# Patient Record
Sex: Female | Born: 1990 | Race: White | Hispanic: No | Marital: Single | Smoking: Former smoker
Health system: Southern US, Community
[De-identification: ages and names within clinical notes are randomized; demographics above are authoritative.]

## PROBLEM LIST (undated history)

## (undated) DIAGNOSIS — F419 Anxiety disorder, unspecified: Secondary | ICD-10-CM

## (undated) DIAGNOSIS — F32A Depression, unspecified: Secondary | ICD-10-CM

## (undated) DIAGNOSIS — B009 Herpesviral infection, unspecified: Secondary | ICD-10-CM

## (undated) DIAGNOSIS — F329 Major depressive disorder, single episode, unspecified: Secondary | ICD-10-CM

## (undated) HISTORY — DX: Anxiety disorder, unspecified: F41.9

## (undated) HISTORY — DX: Herpesviral infection, unspecified: B00.9

## (undated) HISTORY — PX: WISDOM TOOTH EXTRACTION: SHX21

## (undated) HISTORY — DX: Major depressive disorder, single episode, unspecified: F32.9

## (undated) HISTORY — DX: Depression, unspecified: F32.A

---

## 2012-11-22 LAB — OB RESULTS CONSOLE RPR: RPR: NONREACTIVE

## 2012-11-22 LAB — OB RESULTS CONSOLE HGB/HCT, BLOOD
HCT: 40 %
Hemoglobin: 13.8 g/dL

## 2012-11-22 LAB — OB RESULTS CONSOLE ABO/RH: RH Type: POSITIVE

## 2012-11-22 LAB — OB RESULTS CONSOLE HIV ANTIBODY (ROUTINE TESTING): HIV: NONREACTIVE

## 2012-11-22 LAB — OB RESULTS CONSOLE ANTIBODY SCREEN: Antibody Screen: NEGATIVE

## 2012-11-22 LAB — OB RESULTS CONSOLE VARICELLA ZOSTER ANTIBODY, IGG: Varicella: IMMUNE

## 2013-02-15 ENCOUNTER — Other Ambulatory Visit (HOSPITAL_COMMUNITY)
Admission: RE | Admit: 2013-02-15 | Discharge: 2013-02-15 | Disposition: A | Discharge status: Home / Self Care | Payer: Medicaid Other | Source: Ambulatory Visit | Attending: Obstetrics & Gynecology | Admitting: Obstetrics & Gynecology

## 2013-02-15 ENCOUNTER — Ambulatory Visit (INDEPENDENT_AMBULATORY_CARE_PROVIDER_SITE_OTHER): Payer: Medicaid Other | Admitting: Obstetrics & Gynecology

## 2013-02-15 ENCOUNTER — Encounter: Payer: Self-pay | Admitting: Obstetrics & Gynecology

## 2013-02-15 VITALS — BP 107/55 | Ht 62.0 in | Wt 123.0 lb

## 2013-02-15 DIAGNOSIS — N760 Acute vaginitis: Secondary | ICD-10-CM | POA: Insufficient documentation

## 2013-02-15 DIAGNOSIS — Z3480 Encounter for supervision of other normal pregnancy, unspecified trimester: Secondary | ICD-10-CM

## 2013-02-15 DIAGNOSIS — Z3492 Encounter for supervision of normal pregnancy, unspecified, second trimester: Secondary | ICD-10-CM

## 2013-02-15 NOTE — Patient Instructions (Signed)
Pregnancy - Second Trimester The second trimester of pregnancy (3 to 6 months) is a period of rapid growth for you and your baby. At the end of the sixth month, your baby is about 9 inches long and weighs 1 1/2 pounds. You will begin to feel the baby move between 18 and 20 weeks of the pregnancy. This is called quickening. Weight gain is faster. A clear fluid (colostrum) may leak out of your breasts. You may feel small contractions of the womb (uterus). This is known as false labor or Braxton-Hicks contractions. This is like a practice for labor when the baby is ready to be born. Usually, the problems with morning sickness have usually passed by the end of your first trimester. Some women develop small dark blotches (called cholasma, mask of pregnancy) on their face that usually goes away after the baby is born. Exposure to the sun makes the blotches worse. Acne may also develop in some pregnant women and pregnant women who have acne, may find that it goes away. PRENATAL EXAMS  Blood work may continue to be done during prenatal exams. These tests are done to check on your health and the probable health of your baby. Blood work is used to follow your blood levels (hemoglobin). Anemia (low hemoglobin) is common during pregnancy. Iron and vitamins are given to help prevent this. You will also be checked for diabetes between 24 and 28 weeks of the pregnancy. Some of the previous blood tests may be repeated.  The size of the uterus is measured during each visit. This is to make sure that the baby is continuing to grow properly according to the dates of the pregnancy.  Your blood pressure is checked every prenatal visit. This is to make sure you are not getting toxemia.  Your urine is checked to make sure you do not have an infection, diabetes or protein in the urine.  Your weight is checked often to make sure gains are happening at the suggested rate. This is to ensure that both you and your baby are  growing normally.  Sometimes, an ultrasound is performed to confirm the proper growth and development of the baby. This is a test which bounces harmless sound waves off the baby so your caregiver can more accurately determine due dates. Sometimes, a test is done on the amniotic fluid surrounding the baby. This test is called an amniocentesis. The amniotic fluid is obtained by sticking a needle into the belly (abdomen). This is done to check the chromosomes in instances where there is a concern about possible genetic problems with the baby. It is also sometimes done near the end of pregnancy if an early delivery is required. In this case, it is done to help make sure the baby's lungs are mature enough for the baby to live outside of the womb. CHANGES OCCURING IN THE SECOND TRIMESTER OF PREGNANCY Your body goes through many changes during pregnancy. They vary from person to person. Talk to your caregiver about changes you notice that you are concerned about.  During the second trimester, you will likely have an increase in your appetite. It is normal to have cravings for certain foods. This varies from person to person and pregnancy to pregnancy.  Your lower abdomen will begin to bulge.  You may have to urinate more often because the uterus and baby are pressing on your bladder. It is also common to get more bladder infections during pregnancy. You can help this by drinking lots of fluids   and emptying your bladder before and after intercourse.  You may begin to get stretch marks on your hips, abdomen, and breasts. These are normal changes in the body during pregnancy. There are no exercises or medicines to take that prevent this change.  You may begin to develop swollen and bulging veins (varicose veins) in your legs. Wearing support hose, elevating your feet for 15 minutes, 3 to 4 times a day and limiting salt in your diet helps lessen the problem.  Heartburn may develop as the uterus grows and  pushes up against the stomach. Antacids recommended by your caregiver helps with this problem. Also, eating smaller meals 4 to 5 times a day helps.  Constipation can be treated with a stool softener or adding bulk to your diet. Drinking lots of fluids, and eating vegetables, fruits, and whole grains are helpful.  Exercising is also helpful. If you have been very active up until your pregnancy, most of these activities can be continued during your pregnancy. If you have been less active, it is helpful to start an exercise program such as walking.  Hemorrhoids may develop at the end of the second trimester. Warm sitz baths and hemorrhoid cream recommended by your caregiver helps hemorrhoid problems.  Backaches may develop during this time of your pregnancy. Avoid heavy lifting, wear low heal shoes, and practice good posture to help with backache problems.  Some pregnant women develop tingling and numbness of their hand and fingers because of swelling and tightening of ligaments in the wrist (carpel tunnel syndrome). This goes away after the baby is born.  As your breasts enlarge, you may have to get a bigger bra. Get a comfortable, cotton, support bra. Do not get a nursing bra until the last month of the pregnancy if you will be nursing the baby.  You may get a dark line from your belly button to the pubic area called the linea nigra.  You may develop rosy cheeks because of increase blood flow to the face.  You may develop spider looking lines of the face, neck, arms, and chest. These go away after the baby is born. HOME CARE INSTRUCTIONS   It is extremely important to avoid all smoking, herbs, alcohol, and unprescribed drugs during your pregnancy. These chemicals affect the formation and growth of the baby. Avoid these chemicals throughout the pregnancy to ensure the delivery of a healthy infant.  Most of your home care instructions are the same as suggested for the first trimester of your  pregnancy. Keep your caregiver's appointments. Follow your caregiver's instructions regarding medicine use, exercise, and diet.  During pregnancy, you are providing food for you and your baby. Continue to eat regular, well-balanced meals. Choose foods such as meat, fish, milk and other low fat dairy products, vegetables, fruits, and whole-grain breads and cereals. Your caregiver will tell you of the ideal weight gain.  A physical sexual relationship may be continued up until near the end of pregnancy if there are no other problems. Problems could include early (premature) leaking of amniotic fluid from the membranes, vaginal bleeding, abdominal pain, or other medical or pregnancy problems.  Exercise regularly if there are no restrictions. Check with your caregiver if you are unsure of the safety of some of your exercises. The greatest weight gain will occur in the last 2 trimesters of pregnancy. Exercise will help you:  Control your weight.  Get you in shape for labor and delivery.  Lose weight after you have the baby.  Wear   a good support or jogging bra for breast tenderness during pregnancy. This may help if worn during sleep. Pads or tissues may be used in the bra if you are leaking colostrum.  Do not use hot tubs, steam rooms or saunas throughout the pregnancy.  Wear your seat belt at all times when driving. This protects you and your baby if you are in an accident.  Avoid raw meat, uncooked cheese, cat litter boxes, and soil used by cats. These carry germs that can cause birth defects in the baby.  The second trimester is also a good time to visit your dentist for your dental health if this has not been done yet. Getting your teeth cleaned is okay. Use a soft toothbrush. Brush gently during pregnancy.  It is easier to leak urine during pregnancy. Tightening up and strengthening the pelvic muscles will help with this problem. Practice stopping your urination while you are going to the  bathroom. These are the same muscles you need to strengthen. It is also the muscles you would use as if you were trying to stop from passing gas. You can practice tightening these muscles up 10 times a set and repeating this about 3 times per day. Once you know what muscles to tighten up, do not perform these exercises during urination. It is more likely to contribute to an infection by backing up the urine.  Ask for help if you have financial, counseling, or nutritional needs during pregnancy. Your caregiver will be able to offer counseling for these needs as well as refer you for other special needs.  Your skin may become oily. If so, wash your face with mild soap, use non-greasy moisturizer and oil or cream based makeup. MEDICINES AND DRUG USE IN PREGNANCY  Take prenatal vitamins as directed. The vitamin should contain 1 milligram of folic acid. Keep all vitamins out of reach of children. Only a couple vitamins or tablets containing iron may be fatal to a baby or young child when ingested.  Avoid use of all medicines, including herbs, over-the-counter medicines, not prescribed or suggested by your caregiver. Only take over-the-counter or prescription medicines for pain, discomfort, or fever as directed by your caregiver. Do not use aspirin.  Let your caregiver also know about herbs you may be using.  Alcohol is related to a number of birth defects. This includes fetal alcohol syndrome. All alcohol, in any form, should be avoided completely. Smoking will cause low birth rate and premature babies.  Street or illegal drugs are very harmful to the baby. They are absolutely forbidden. A baby born to an addicted mother will be addicted at birth. The baby will go through the same withdrawal an adult does. SEEK MEDICAL CARE IF:  You have any concerns or worries during your pregnancy. It is better to call with your questions if you feel they cannot wait, rather than worry about them. SEEK IMMEDIATE  MEDICAL CARE IF:   An unexplained oral temperature above 102 F (38.9 C) develops, or as your caregiver suggests.  You have leaking of fluid from the vagina (birth canal). If leaking membranes are suspected, take your temperature and tell your caregiver of this when you call.  There is vaginal spotting, bleeding, or passing clots. Tell your caregiver of the amount and how many pads are used. Light spotting in pregnancy is common, especially following intercourse.  You develop a bad smelling vaginal discharge with a change in the color from clear to white.  You continue to feel   sick to your stomach (nauseated) and have no relief from remedies suggested. You vomit blood or coffee ground-like materials.  You lose more than 2 pounds of weight or gain more than 2 pounds of weight over 1 week, or as suggested by your caregiver.  You notice swelling of your face, hands, feet, or legs.  You get exposed to German measles and have never had them.  You are exposed to fifth disease or chickenpox.  You develop belly (abdominal) pain. Round ligament discomfort is a common non-cancerous (benign) cause of abdominal pain in pregnancy. Your caregiver still must evaluate you.  You develop a bad headache that does not go away.  You develop fever, diarrhea, pain with urination, or shortness of breath.  You develop visual problems, blurry, or double vision.  You fall or are in a car accident or any kind of trauma.  There is mental or physical violence at home. Document Released: 04/29/2001 Document Revised: 01/28/2012 Document Reviewed: 11/01/2008 [employer] Patient Information 2014 [employer].  

## 2013-02-15 NOTE — Progress Notes (Signed)
P - 79 - pt is a transfer from [facility] in [place], [place]

## 2013-02-15 NOTE — Progress Notes (Signed)
   Subjective: transfer from [facility] is a G1P0 [redacted]w[redacted]d being seen today for her first obstetrical visit.  Her obstetrical history is significant for former smoker. Patient does intend to breast feed. Pregnancy history fully reviewed.  Patient reports no complaints.  Filed Vitals:   02/15/13 0946 02/15/13 0947  BP: 107/55   Height:  5\' 2"  (1.575 m)  Weight: 123 lb (55.792 kg)     HISTORY: OB History  Gravida Para Term Preterm AB SAB TAB Ectopic Multiple Living  1             # Outcome Date GA Lbr Len/2nd Weight Sex Delivery Anes PTL Lv  1 CUR              Past Medical History  Diagnosis Date  . Depression   . Anxiety    Past Surgical History  Procedure Laterality Date  . Wisdom tooth extraction     Family History  Problem Relation Age of Onset  . Hypertension Mother   . Ovarian cancer Paternal Grandmother   . Ovarian cancer Maternal Grandmother   . Breast cancer Paternal Grandmother   . Breast cancer Maternal Grandmother      Exam    Uterus:     Pelvic Exam:    Perineum: No Hemorrhoids   Vulva: normal   Vagina:  normal mucosa   pH:     Cervix: no lesions   Adnexa: normal adnexa   Bony Pelvis: average  System: Breast:  normal appearance, no masses or tenderness   Skin: normal coloration and turgor, small area rash over sternum   Neurologic: oriented, normal mood   Extremities: normal strength, tone, and muscle mass   HEENT sclera clear, anicteric, oropharynx clear, no lesions, thyroid without masses and trachea midline   Mouth/Teeth mucous membranes moist, pharynx normal without lesions, dental hygiene good and dental carie #15   Neck supple   Cardiovascular: regular rate and rhythm, no murmurs or gallops   Respiratory:  appears well, vitals normal, no respiratory distress, acyanotic, normal RR, neck free of mass or lymphadenopathy, chest clear, no wheezing, crepitations, rhonchi, normal symmetric air entry   Abdomen: soft, non-tender; bowel  sounds normal; no masses,  no organomegaly   Urinary: urethral meatus normal      Assessment:    Pregnancy: G1P0 Patient Active Problem List   Diagnosis Date Noted  . Supervision of normal pregnancy in second trimester 02/15/2013        Plan:     Initial labs drawn. Prenatal vitamins. Problem list reviewed and updated. Genetic Screening discussed Quad Screen: ordered.  Ultrasound discussed; fetal survey: ordered.  Follow up in 4 weeks. 50% of 30 min visit spent on counseling and coordination of care.  Wet prep   ARNOLD,JAMES 02/15/2013

## 2013-02-17 LAB — ALPHA FETOPROTEIN, MATERNAL
Curr Gest Age: 18 wks.days
MoM for AFP: 1.47
Open Spina bifida: NEGATIVE

## 2013-02-17 LAB — GC/CHLAMYDIA PROBE AMP
CT Probe RNA: NEGATIVE
GC Probe RNA: NEGATIVE

## 2013-02-21 ENCOUNTER — Telehealth: Payer: Self-pay | Admitting: *Deleted

## 2013-02-21 DIAGNOSIS — B9689 Other specified bacterial agents as the cause of diseases classified elsewhere: Secondary | ICD-10-CM

## 2013-02-21 MED ORDER — METRONIDAZOLE 500 MG PO TABS: 500.0000 mg | ORAL_TABLET | Freq: Two times a day (BID) | ORAL | Status: DC

## 2013-02-21 NOTE — Telephone Encounter (Signed)
Called pt to adv meds have been sent to pharm - LM with Female who answered phone - he adv will have pt call me back.

## 2013-02-22 ENCOUNTER — Encounter: Payer: Self-pay | Admitting: *Deleted

## 2013-02-24 ENCOUNTER — Other Ambulatory Visit: Payer: Self-pay | Admitting: Obstetrics & Gynecology

## 2013-02-24 ENCOUNTER — Ambulatory Visit (HOSPITAL_COMMUNITY)
Admission: RE | Admit: 2013-02-24 | Discharge: 2013-02-24 | Disposition: A | Discharge status: Home / Self Care | Payer: Medicaid Other | Source: Ambulatory Visit | Attending: Obstetrics & Gynecology | Admitting: Obstetrics & Gynecology

## 2013-02-24 DIAGNOSIS — Z3492 Encounter for supervision of normal pregnancy, unspecified, second trimester: Secondary | ICD-10-CM

## 2013-02-24 DIAGNOSIS — O358XX0 Maternal care for other (suspected) fetal abnormality and damage, not applicable or unspecified: Secondary | ICD-10-CM | POA: Insufficient documentation

## 2013-02-24 DIAGNOSIS — Z1389 Encounter for screening for other disorder: Secondary | ICD-10-CM | POA: Insufficient documentation

## 2013-02-24 DIAGNOSIS — O99330 Smoking (tobacco) complicating pregnancy, unspecified trimester: Secondary | ICD-10-CM | POA: Insufficient documentation

## 2013-02-24 DIAGNOSIS — Z363 Encounter for antenatal screening for malformations: Secondary | ICD-10-CM | POA: Insufficient documentation

## 2013-03-15 ENCOUNTER — Ambulatory Visit (INDEPENDENT_AMBULATORY_CARE_PROVIDER_SITE_OTHER): Payer: Medicaid Other | Admitting: Obstetrics & Gynecology

## 2013-03-15 VITALS — BP 109/60 | Wt 129.0 lb

## 2013-03-15 DIAGNOSIS — Z3480 Encounter for supervision of other normal pregnancy, unspecified trimester: Secondary | ICD-10-CM

## 2013-03-15 DIAGNOSIS — Z3492 Encounter for supervision of normal pregnancy, unspecified, second trimester: Secondary | ICD-10-CM

## 2013-03-15 NOTE — Progress Notes (Signed)
P= 80 

## 2013-03-15 NOTE — Patient Instructions (Signed)
Pregnancy - Second Trimester The second trimester is the period between 13 to 27 weeks of your pregnancy. It is important to follow your doctor's instructions. HOME CARE   Do not smoke.  Do not drink alcohol or use drugs.  Only take medicine as told by your doctor.  Take prenatal vitamins as told. The vitamin should contain 1 milligram of folic acid.  Exercise.  Eat healthy foods. Eat regular, well-balanced meals.  You can have sex (intercourse) if there are no other problems with the pregnancy.  Do not use hot tubs, steam rooms, or saunas.  Wear a seat belt while driving.  Avoid raw meat, uncooked cheese, and litter boxes and soil used by cats.  Visit your dentist. Cleanings are okay. GET HELP RIGHT AWAY IF:   You have a temperature by mouth above 102 F (38.9 C), not controlled by medicine.  Fluid is coming from your vagina.  Blood is coming from your vagina. Light spotting is common, especially after sex (intercourse).  You have a bad smelling fluid (discharge) coming from the vagina. The fluid changes from clear to white.  You still feel sick to your stomach (nauseous).  You throw up (vomit) blood.  You lose or gain more than 2 pounds (0.9 kilograms) of weight in a week, or as suggested by your doctor.  Your face, hands, feet, or legs get puffy (swell).  You get exposed to German measles and have never had them.  You get exposed to fifth disease or chickenpox.  You have belly (abdominal) pain.  You have a bad headache that will not go away.  You have watery poop (diarrhea), pain when you pee (urinate), or have shortness of breath.  You start to have problems seeing (blurry or double vision).  You fall, are in a car accident, or have any kind of trauma.  There is mental or physical violence at home.  You have any concerns or worries during your pregnancy. MAKE SURE YOU:   Understand these instructions.  Will watch your condition.  Will get help  right away if you are not doing well or get worse. Document Released: 07/30/2009 Document Revised: 07/28/2011 Document Reviewed: 07/30/2009 Exit[employer] Patient Information 2014 [employer].  

## 2013-03-15 NOTE — Progress Notes (Signed)
[place] was nl, rec f/u for cardiac OFT views

## 2013-03-18 ENCOUNTER — Ambulatory Visit (HOSPITAL_COMMUNITY)
Admission: RE | Admit: 2013-03-18 | Discharge: 2013-03-18 | Disposition: A | Discharge status: Home / Self Care | Payer: Medicaid Other | Source: Ambulatory Visit | Attending: Obstetrics & Gynecology | Admitting: Obstetrics & Gynecology

## 2013-03-18 ENCOUNTER — Ambulatory Visit (HOSPITAL_COMMUNITY): Admission: RE | Admit: 2013-03-18 | Payer: Medicaid Other | Source: Ambulatory Visit

## 2013-03-18 DIAGNOSIS — O99330 Smoking (tobacco) complicating pregnancy, unspecified trimester: Secondary | ICD-10-CM | POA: Insufficient documentation

## 2013-03-18 DIAGNOSIS — Z3689 Encounter for other specified antenatal screening: Secondary | ICD-10-CM | POA: Insufficient documentation

## 2013-03-18 DIAGNOSIS — Z3492 Encounter for supervision of normal pregnancy, unspecified, second trimester: Secondary | ICD-10-CM

## 2013-04-05 ENCOUNTER — Other Ambulatory Visit (HOSPITAL_COMMUNITY)
Admission: RE | Admit: 2013-04-05 | Discharge: 2013-04-05 | Disposition: A | Discharge status: Home / Self Care | Payer: Medicaid Other | Source: Ambulatory Visit | Attending: Obstetrics & Gynecology | Admitting: Obstetrics & Gynecology

## 2013-04-05 ENCOUNTER — Ambulatory Visit (INDEPENDENT_AMBULATORY_CARE_PROVIDER_SITE_OTHER): Payer: Medicaid Other | Admitting: Obstetrics & Gynecology

## 2013-04-05 VITALS — BP 111/74 | Wt 133.0 lb

## 2013-04-05 DIAGNOSIS — Z3480 Encounter for supervision of other normal pregnancy, unspecified trimester: Secondary | ICD-10-CM

## 2013-04-05 DIAGNOSIS — Z3492 Encounter for supervision of normal pregnancy, unspecified, second trimester: Secondary | ICD-10-CM

## 2013-04-05 DIAGNOSIS — Z3482 Encounter for supervision of other normal pregnancy, second trimester: Secondary | ICD-10-CM

## 2013-04-05 DIAGNOSIS — Z23 Encounter for immunization: Secondary | ICD-10-CM

## 2013-04-05 DIAGNOSIS — N760 Acute vaginitis: Secondary | ICD-10-CM | POA: Insufficient documentation

## 2013-04-05 MED ORDER — FLUCONAZOLE 150 MG PO TABS: 150.0000 mg | ORAL_TABLET | Freq: Once | ORAL | Status: DC

## 2013-04-05 NOTE — Progress Notes (Signed)
P - 114 - Pt states she may have a yeast infection and wants to be tested today - 1HrGTT, flu vaccine and TDAP vaccine today

## 2013-04-05 NOTE — Progress Notes (Signed)
Vaginal discharge and erythema c/w likely yeast vaginitis. BD affirm sent. Will Rx Diflucan 150 mg po x1. F/U US 10/31 normal anatomy scan

## 2013-04-05 NOTE — Patient Instructions (Signed)
Second Trimester of Pregnancy The second trimester is from week 13 through week 28, months 4 through 6. The second trimester is often a time when you feel your best. Your body has also adjusted to being pregnant, and you begin to feel better physically. Usually, morning sickness has lessened or quit completely, you may have more energy, and you may have an increase in appetite. The second trimester is also a time when the fetus is growing rapidly. At the end of the sixth month, the fetus is about 9 inches long and weighs about 1 pounds. You will likely begin to feel the baby move (quickening) between 18 and 20 weeks of the pregnancy. BODY CHANGES Your body goes through many changes during pregnancy. The changes vary from woman to woman.   Your weight will continue to increase. You will notice your lower abdomen bulging out.  You may begin to get stretch marks on your hips, abdomen, and breasts.  You may develop headaches that can be relieved by medicines approved by your caregiver.  You may urinate more often because the fetus is pressing on your bladder.  You may develop or continue to have heartburn as a result of your pregnancy.  You may develop constipation because certain hormones are causing the muscles that push waste through your intestines to slow down.  You may develop hemorrhoids or swollen, bulging veins (varicose veins).  You may have back pain because of the weight gain and pregnancy hormones relaxing your joints between the bones in your pelvis and as a result of a shift in weight and the muscles that support your balance.  Your breasts will continue to grow and be tender.  Your gums may bleed and may be sensitive to brushing and flossing.  Dark spots or blotches (chloasma, mask of pregnancy) may develop on your face. This will likely fade after the baby is born.  A dark line from your belly button to the pubic area (linea nigra) may appear. This will likely fade after the  baby is born. WHAT TO EXPECT AT YOUR PRENATAL VISITS During a routine prenatal visit:  You will be weighed to make sure you and the fetus are growing normally.  Your blood pressure will be taken.  Your abdomen will be measured to track your baby's growth.  The fetal heartbeat will be listened to.  Any test results from the previous visit will be discussed. Your caregiver may ask you:  How you are feeling.  If you are feeling the baby move.  If you have had any abnormal symptoms, such as leaking fluid, bleeding, severe headaches, or abdominal cramping.  If you have any questions. Other tests that may be performed during your second trimester include:  Blood tests that check for:  Low iron levels (anemia).  Gestational diabetes (between 24 and 28 weeks).  Rh antibodies.  Urine tests to check for infections, diabetes, or protein in the urine.  An ultrasound to confirm the proper growth and development of the baby.  An amniocentesis to check for possible genetic problems.  Fetal screens for spina bifida and Down syndrome. HOME CARE INSTRUCTIONS   Avoid all smoking, herbs, alcohol, and unprescribed drugs. These chemicals affect the formation and growth of the baby.  Follow your caregiver's instructions regarding medicine use. There are medicines that are either safe or unsafe to take during pregnancy.  Exercise only as directed by your caregiver. Experiencing uterine cramps is a good sign to stop exercising.  Continue to eat regular,   healthy meals.  Wear a good support bra for breast tenderness.  Do not use hot tubs, steam rooms, or saunas.  Wear your seat belt at all times when driving.  Avoid raw meat, uncooked cheese, cat litter boxes, and soil used by cats. These carry germs that can cause birth defects in the baby.  Take your prenatal vitamins.  Try taking a stool softener (if your caregiver approves) if you develop constipation. Eat more high-fiber foods,  such as fresh vegetables or fruit and whole grains. Drink plenty of fluids to keep your urine clear or pale yellow.  Take warm sitz baths to soothe any pain or discomfort caused by hemorrhoids. Use hemorrhoid cream if your caregiver approves.  If you develop varicose veins, wear support hose. Elevate your feet for 15 minutes, 3 4 times a day. Limit salt in your diet.  Avoid heavy lifting, wear low heel shoes, and practice good posture.  Rest with your legs elevated if you have leg cramps or low back pain.  Visit your dentist if you have not gone yet during your pregnancy. Use a soft toothbrush to brush your teeth and be gentle when you floss.  A sexual relationship may be continued unless your caregiver directs you otherwise.  Continue to go to all your prenatal visits as directed by your caregiver. SEEK MEDICAL CARE IF:   You have dizziness.  You have mild pelvic cramps, pelvic pressure, or nagging pain in the abdominal area.  You have persistent nausea, vomiting, or diarrhea.  You have a bad smelling vaginal discharge.  You have pain with urination. SEEK IMMEDIATE MEDICAL CARE IF:   You have a fever.  You are leaking fluid from your vagina.  You have spotting or bleeding from your vagina.  You have severe abdominal cramping or pain.  You have rapid weight gain or loss.  You have shortness of breath with chest pain.  You notice sudden or extreme swelling of your face, hands, ankles, feet, or legs.  You have not felt your baby move in over an hour.  You have severe headaches that do not go away with medicine.  You have vision changes. Document Released: 04/29/2001 Document Revised: 01/05/2013 Document Reviewed: 07/06/2012 [employer] Patient Information 2014 [employer].  

## 2013-04-06 ENCOUNTER — Telehealth: Payer: Self-pay | Admitting: *Deleted

## 2013-04-06 LAB — RPR

## 2013-04-06 LAB — CBC
Hemoglobin: 10.7 g/dL — ABNORMAL LOW (ref 12.0–15.0)
MCHC: 34.3 g/dL (ref 30.0–36.0)
RBC: 3.41 MIL/uL — ABNORMAL LOW (ref 3.87–5.11)
WBC: 14.8 10*3/uL — ABNORMAL HIGH (ref 4.0–10.5)

## 2013-04-06 LAB — GC/CHLAMYDIA PROBE AMP
CT Probe RNA: NEGATIVE
GC Probe RNA: NEGATIVE

## 2013-04-06 LAB — HIV ANTIBODY (ROUTINE TESTING W REFLEX): HIV: NONREACTIVE

## 2013-04-06 NOTE — Telephone Encounter (Signed)
Called pt to adv all labs WNL - LMOM  

## 2013-04-26 ENCOUNTER — Ambulatory Visit (INDEPENDENT_AMBULATORY_CARE_PROVIDER_SITE_OTHER): Payer: Medicaid Other | Admitting: Obstetrics & Gynecology

## 2013-04-26 VITALS — BP 115/67 | Wt 139.0 lb

## 2013-04-26 DIAGNOSIS — Z3480 Encounter for supervision of other normal pregnancy, unspecified trimester: Secondary | ICD-10-CM

## 2013-04-26 DIAGNOSIS — Z3492 Encounter for supervision of normal pregnancy, unspecified, second trimester: Secondary | ICD-10-CM

## 2013-04-26 NOTE — Patient Instructions (Signed)
Return to clinic for any obstetric concerns or go to [facility] for evaluation  

## 2013-04-26 NOTE — Progress Notes (Signed)
P = 119 

## 2013-04-26 NOTE — Progress Notes (Signed)
1 hr GTT 85.   No other complaints or concerns.  Fetal movement and labor precautions reviewed.

## 2013-05-10 ENCOUNTER — Ambulatory Visit (INDEPENDENT_AMBULATORY_CARE_PROVIDER_SITE_OTHER): Payer: Medicaid Other | Admitting: Obstetrics & Gynecology

## 2013-05-10 VITALS — BP 109/69 | Wt 141.0 lb

## 2013-05-10 DIAGNOSIS — A6000 Herpesviral infection of urogenital system, unspecified: Secondary | ICD-10-CM

## 2013-05-10 DIAGNOSIS — A6009 Herpesviral infection of other urogenital tract: Secondary | ICD-10-CM | POA: Insufficient documentation

## 2013-05-10 DIAGNOSIS — Z3482 Encounter for supervision of other normal pregnancy, second trimester: Secondary | ICD-10-CM

## 2013-05-10 DIAGNOSIS — Z3480 Encounter for supervision of other normal pregnancy, unspecified trimester: Secondary | ICD-10-CM

## 2013-05-10 MED ORDER — VALACYCLOVIR HCL 1 G PO TABS: 1000.0000 mg | ORAL_TABLET | Freq: Every day | ORAL | Status: DC

## 2013-05-10 MED ORDER — FLUCONAZOLE 150 MG PO TABS: 150.0000 mg | ORAL_TABLET | Freq: Once | ORAL | Status: DC

## 2013-05-10 NOTE — Patient Instructions (Signed)
Herpes During and After Pregnancy Genital herpes is a sexually transmitted infection (STI). It is caused by a virus and can be very serious during pregnancy. The greatest concern is passing the virus to the fetus and newborn. The virus is more likely to be passed to the newborn during delivery if the mother becomes infected for the first time late in her pregnancy (primary infection). It is less common for the virus to pass to the newborn if the mother had herpes before becoming pregnant. This is because antibodies against the virus develop over a period of time. These antibodies help protect the baby. Lastly, the infection can pass to the fetus through the placenta. This can happen if the mother gets herpes for the first time in the first 3 months of her pregnancy (first trimester). This can possibly cause a miscarriage or birth defects in the baby. TREATMENT DURING PREGNANCY Medicines may be prescribed that are safe for the mother and the fetus. The medicine can lessen symptoms or prevent a recurrence of the infection. If the infection happened before becoming pregnant, medicine may be prescribed in the last 4 weeks of the pregnancy. This can prevent a recurrent infection at the time of delivery.  RECOMMENDED DELIVERY METHOD If an active, recurrent infection is present at the time delivery, the baby should be delivered by cesarean delivery. This is because the virus can pass to the baby through an infected birth canal. This can cause severe problems for the baby. If the infection happens for the first time late in the pregnancy, the caregiver may also recommend a cesarean delivery. With a new infection, the body has not had the time to build up enough antibodies against the virus to protect the baby from getting the infection. Even if the birth canal does not have visible sores (lesions) from the herpes virus, there is still that chance that the virus can spread to the baby. A cesarean delivery should be  done if there is any signs or feeling of an infection being present in the genital area. Cesarean delivery is not recommended for women with a history of herpes infection but no evidence of active genital lesions at the time of delivery. Lesions that have crusted fully are considered healed and not active. BREASTFEEDING  Women infected with genital herpes can breastfeed their baby. The virus will not be present in the breast milk. If lesions are present on the breast, the baby should not breastfeed from the affected breast(s). HOW TO PREVENT PASSING HERPES TO YOUR BABY AFTER DELIVERY  Wash your hands with soap and water often and before touching your baby.  If you have an outbreak, keep the area clean and covered.  Try to avoid physical and stressful situations that may bring on an outbreak. SEEK IMMEDIATE MEDICAL CARE IF:   You have an outbreak during pregnancy and cannot urinate.  You have an outbreak anytime during your pregnancy and especially in the last 3 months of the pregnancy.  You think you are having an allergic reaction or side effects from the medicine you are taking. Document Released: 08/11/2000 Document Revised: 07/28/2011 Document Reviewed: 04/15/2011 [employer] Patient Information 2014 [employer].  

## 2013-05-10 NOTE — Progress Notes (Signed)
rx valtrex for herpes outbreak. Sharp constant RUQ pain not related to meals no nausea, not tender, c/w M/S strain. Tylenol heat and ice pack recommended

## 2013-05-10 NOTE — Progress Notes (Signed)
P - 101 - Pt states she thinks she is "beginning to have herpes outbreak and possible yeast infection" - Pt also states she has constant RUQ pain

## 2013-05-10 NOTE — Addendum Note (Signed)
Addended by: Arne Cleveland on: 05/10/2013 01:31 PM   Modules accepted: Orders

## 2013-05-11 LAB — GC/CHLAMYDIA PROBE AMP
CT Probe RNA: NEGATIVE
GC Probe RNA: NEGATIVE

## 2013-05-23 ENCOUNTER — Ambulatory Visit (INDEPENDENT_AMBULATORY_CARE_PROVIDER_SITE_OTHER): Payer: Medicaid Other | Admitting: Obstetrics & Gynecology

## 2013-05-23 VITALS — BP 106/54 | Wt 145.0 lb

## 2013-05-23 DIAGNOSIS — Z3480 Encounter for supervision of other normal pregnancy, unspecified trimester: Secondary | ICD-10-CM

## 2013-05-23 DIAGNOSIS — Z3492 Encounter for supervision of normal pregnancy, unspecified, second trimester: Secondary | ICD-10-CM

## 2013-05-23 MED ORDER — PANTOPRAZOLE SODIUM 40 MG PO TBEC: 40.0000 mg | DELAYED_RELEASE_TABLET | Freq: Every day | ORAL | Status: DC

## 2013-05-23 NOTE — Progress Notes (Signed)
P - 98 - Pt states she needs meds for acid reflux

## 2013-05-23 NOTE — Progress Notes (Signed)
Rx Protonix for GERD. Will need Valtrex in last month.

## 2013-05-23 NOTE — Patient Instructions (Signed)
Third Trimester of Pregnancy  The third trimester is from week 29 through week 42, months 7 through 9. The third trimester is a time when the fetus is growing rapidly. At the end of the ninth month, the fetus is about 20 inches in length and weighs 6 10 pounds.   BODY CHANGES  Your body goes through many changes during pregnancy. The changes vary from woman to woman.    Your weight will continue to increase. You can expect to gain 25 35 pounds (11 16 kg) by the end of the pregnancy.   You may begin to get stretch marks on your hips, abdomen, and breasts.   You may urinate more often because the fetus is moving lower into your pelvis and pressing on your bladder.   You may develop or continue to have heartburn as a result of your pregnancy.   You may develop constipation because certain hormones are causing the muscles that push waste through your intestines to slow down.   You may develop hemorrhoids or swollen, bulging veins (varicose veins).   You may have pelvic pain because of the weight gain and pregnancy hormones relaxing your joints between the bones in your pelvis. Back aches may result from over exertion of the muscles supporting your posture.   Your breasts will continue to grow and be tender. A yellow discharge may leak from your breasts called colostrum.   Your belly button may stick out.   You may feel short of breath because of your expanding uterus.   You may notice the fetus "dropping," or moving lower in your abdomen.   You may have a bloody mucus discharge. This usually occurs a few days to a week before labor begins.   Your cervix becomes thin and soft (effaced) near your due date.  WHAT TO EXPECT AT YOUR PRENATAL EXAMS   You will have prenatal exams every 2 weeks until week 36. Then, you will have weekly prenatal exams. During a routine prenatal visit:   You will be weighed to make sure you and the fetus are growing normally.   Your blood pressure is taken.   Your abdomen will be  measured to track your baby's growth.   The fetal heartbeat will be listened to.   Any test results from the previous visit will be discussed.   You may have a cervical check near your due date to see if you have effaced.  At around 36 weeks, your caregiver will check your cervix. At the same time, your caregiver will also perform a test on the secretions of the vaginal tissue. This test is to determine if a type of bacteria, Group B streptococcus, is present. Your caregiver will explain this further.  Your caregiver may ask you:   What your birth plan is.   How you are feeling.   If you are feeling the baby move.   If you have had any abnormal symptoms, such as leaking fluid, bleeding, severe headaches, or abdominal cramping.   If you have any questions.  Other tests or screenings that may be performed during your third trimester include:   Blood tests that check for low iron levels (anemia).   Fetal testing to check the health, activity level, and growth of the fetus. Testing is done if you have certain medical conditions or if there are problems during the pregnancy.  FALSE LABOR  You may feel small, irregular contractions that eventually go away. These are called Braxton Hicks contractions, or   false labor. Contractions may last for hours, days, or even weeks before true labor sets in. If contractions come at regular intervals, intensify, or become painful, it is best to be seen by your caregiver.   SIGNS OF LABOR    Menstrual-like cramps.   Contractions that are 5 minutes apart or less.   Contractions that start on the top of the uterus and spread down to the lower abdomen and back.   A sense of increased pelvic pressure or back pain.   A watery or bloody mucus discharge that comes from the vagina.  If you have any of these signs before the 37th week of pregnancy, call your caregiver right away. You need to go to the hospital to get checked immediately.  HOME CARE INSTRUCTIONS    Avoid all  smoking, herbs, alcohol, and unprescribed drugs. These chemicals affect the formation and growth of the baby.   Follow your caregiver's instructions regarding medicine use. There are medicines that are either safe or unsafe to take during pregnancy.   Exercise only as directed by your caregiver. Experiencing uterine cramps is a good sign to stop exercising.   Continue to eat regular, healthy meals.   Wear a good support bra for breast tenderness.   Do not use hot tubs, steam rooms, or saunas.   Wear your seat belt at all times when driving.   Avoid raw meat, uncooked cheese, cat litter boxes, and soil used by cats. These carry germs that can cause birth defects in the baby.   Take your prenatal vitamins.   Try taking a stool softener (if your caregiver approves) if you develop constipation. Eat more high-fiber foods, such as fresh vegetables or fruit and whole grains. Drink plenty of fluids to keep your urine clear or pale yellow.   Take warm sitz baths to soothe any pain or discomfort caused by hemorrhoids. Use hemorrhoid cream if your caregiver approves.   If you develop varicose veins, wear support hose. Elevate your feet for 15 minutes, 3 4 times a day. Limit salt in your diet.   Avoid heavy lifting, wear low heal shoes, and practice good posture.   Rest a lot with your legs elevated if you have leg cramps or low back pain.   Visit your dentist if you have not gone during your pregnancy. Use a soft toothbrush to brush your teeth and be gentle when you floss.   A sexual relationship may be continued unless your caregiver directs you otherwise.   Do not travel far distances unless it is absolutely necessary and only with the approval of your caregiver.   Take prenatal classes to understand, practice, and ask questions about the labor and delivery.   Make a trial run to the hospital.   Pack your hospital bag.   Prepare the baby's nursery.   Continue to go to all your prenatal visits as directed  by your caregiver.  SEEK MEDICAL CARE IF:   You are unsure if you are in labor or if your water has broken.   You have dizziness.   You have mild pelvic cramps, pelvic pressure, or nagging pain in your abdominal area.   You have persistent nausea, vomiting, or diarrhea.   You have a bad smelling vaginal discharge.   You have pain with urination.  SEEK IMMEDIATE MEDICAL CARE IF:    You have a fever.   You are leaking fluid from your vagina.   You have spotting or bleeding from your vagina.     You have severe abdominal cramping or pain.   You have rapid weight loss or gain.   You have shortness of breath with chest pain.   You notice sudden or extreme swelling of your face, hands, ankles, feet, or legs.   You have not felt your baby move in over an hour.   You have severe headaches that do not go away with medicine.   You have vision changes.  Document Released: 04/29/2001 Document Revised: 01/05/2013 Document Reviewed: 07/06/2012  [employer] Patient Information 2014 [employer].

## 2013-06-07 ENCOUNTER — Ambulatory Visit (INDEPENDENT_AMBULATORY_CARE_PROVIDER_SITE_OTHER): Payer: Medicaid Other | Admitting: Obstetrics & Gynecology

## 2013-06-07 VITALS — BP 117/68 | Wt 146.0 lb

## 2013-06-07 DIAGNOSIS — Z3492 Encounter for supervision of normal pregnancy, unspecified, second trimester: Secondary | ICD-10-CM

## 2013-06-07 DIAGNOSIS — Z3480 Encounter for supervision of other normal pregnancy, unspecified trimester: Secondary | ICD-10-CM

## 2013-06-07 DIAGNOSIS — A6009 Herpesviral infection of other urogenital tract: Secondary | ICD-10-CM

## 2013-06-07 DIAGNOSIS — A6000 Herpesviral infection of urogenital system, unspecified: Secondary | ICD-10-CM

## 2013-06-07 MED ORDER — VALACYCLOVIR HCL 1 G PO TABS: 500.0000 mg | ORAL_TABLET | Freq: Every day | ORAL | Status: DC

## 2013-06-07 NOTE — Patient Instructions (Signed)
Third Trimester of Pregnancy  The third trimester is from week 29 through week 42, months 7 through 9. The third trimester is a time when the fetus is growing rapidly. At the end of the ninth month, the fetus is about 20 inches in length and weighs 6 10 pounds.   BODY CHANGES  Your body goes through many changes during pregnancy. The changes vary from woman to woman.    Your weight will continue to increase. You can expect to gain 25 35 pounds (11 16 kg) by the end of the pregnancy.   You may begin to get stretch marks on your hips, abdomen, and breasts.   You may urinate more often because the fetus is moving lower into your pelvis and pressing on your bladder.   You may develop or continue to have heartburn as a result of your pregnancy.   You may develop constipation because certain hormones are causing the muscles that push waste through your intestines to slow down.   You may develop hemorrhoids or swollen, bulging veins (varicose veins).   You may have pelvic pain because of the weight gain and pregnancy hormones relaxing your joints between the bones in your pelvis. Back aches may result from over exertion of the muscles supporting your posture.   Your breasts will continue to grow and be tender. A yellow discharge may leak from your breasts called colostrum.   Your belly button may stick out.   You may feel short of breath because of your expanding uterus.   You may notice the fetus "dropping," or moving lower in your abdomen.   You may have a bloody mucus discharge. This usually occurs a few days to a week before labor begins.   Your cervix becomes thin and soft (effaced) near your due date.  WHAT TO EXPECT AT YOUR PRENATAL EXAMS   You will have prenatal exams every 2 weeks until week 36. Then, you will have weekly prenatal exams. During a routine prenatal visit:   You will be weighed to make sure you and the fetus are growing normally.   Your blood pressure is taken.   Your abdomen will be  measured to track your baby's growth.   The fetal heartbeat will be listened to.   Any test results from the previous visit will be discussed.   You may have a cervical check near your due date to see if you have effaced.  At around 36 weeks, your caregiver will check your cervix. At the same time, your caregiver will also perform a test on the secretions of the vaginal tissue. This test is to determine if a type of bacteria, Group B streptococcus, is present. Your caregiver will explain this further.  Your caregiver may ask you:   What your birth plan is.   How you are feeling.   If you are feeling the baby move.   If you have had any abnormal symptoms, such as leaking fluid, bleeding, severe headaches, or abdominal cramping.   If you have any questions.  Other tests or screenings that may be performed during your third trimester include:   Blood tests that check for low iron levels (anemia).   Fetal testing to check the health, activity level, and growth of the fetus. Testing is done if you have certain medical conditions or if there are problems during the pregnancy.  FALSE LABOR  You may feel small, irregular contractions that eventually go away. These are called Braxton Hicks contractions, or   false labor. Contractions may last for hours, days, or even weeks before true labor sets in. If contractions come at regular intervals, intensify, or become painful, it is best to be seen by your caregiver.   SIGNS OF LABOR    Menstrual-like cramps.   Contractions that are 5 minutes apart or less.   Contractions that start on the top of the uterus and spread down to the lower abdomen and back.   A sense of increased pelvic pressure or back pain.   A watery or bloody mucus discharge that comes from the vagina.  If you have any of these signs before the 37th week of pregnancy, call your caregiver right away. You need to go to the hospital to get checked immediately.  HOME CARE INSTRUCTIONS    Avoid all  smoking, herbs, alcohol, and unprescribed drugs. These chemicals affect the formation and growth of the baby.   Follow your caregiver's instructions regarding medicine use. There are medicines that are either safe or unsafe to take during pregnancy.   Exercise only as directed by your caregiver. Experiencing uterine cramps is a good sign to stop exercising.   Continue to eat regular, healthy meals.   Wear a good support bra for breast tenderness.   Do not use hot tubs, steam rooms, or saunas.   Wear your seat belt at all times when driving.   Avoid raw meat, uncooked cheese, cat litter boxes, and soil used by cats. These carry germs that can cause birth defects in the baby.   Take your prenatal vitamins.   Try taking a stool softener (if your caregiver approves) if you develop constipation. Eat more high-fiber foods, such as fresh vegetables or fruit and whole grains. Drink plenty of fluids to keep your urine clear or pale yellow.   Take warm sitz baths to soothe any pain or discomfort caused by hemorrhoids. Use hemorrhoid cream if your caregiver approves.   If you develop varicose veins, wear support hose. Elevate your feet for 15 minutes, 3 4 times a day. Limit salt in your diet.   Avoid heavy lifting, wear low heal shoes, and practice good posture.   Rest a lot with your legs elevated if you have leg cramps or low back pain.   Visit your dentist if you have not gone during your pregnancy. Use a soft toothbrush to brush your teeth and be gentle when you floss.   A sexual relationship may be continued unless your caregiver directs you otherwise.   Do not travel far distances unless it is absolutely necessary and only with the approval of your caregiver.   Take prenatal classes to understand, practice, and ask questions about the labor and delivery.   Make a trial run to the hospital.   Pack your hospital bag.   Prepare the baby's nursery.   Continue to go to all your prenatal visits as directed  by your caregiver.  SEEK MEDICAL CARE IF:   You are unsure if you are in labor or if your water has broken.   You have dizziness.   You have mild pelvic cramps, pelvic pressure, or nagging pain in your abdominal area.   You have persistent nausea, vomiting, or diarrhea.   You have a bad smelling vaginal discharge.   You have pain with urination.  SEEK IMMEDIATE MEDICAL CARE IF:    You have a fever.   You are leaking fluid from your vagina.   You have spotting or bleeding from your vagina.     You have severe abdominal cramping or pain.   You have rapid weight loss or gain.   You have shortness of breath with chest pain.   You notice sudden or extreme swelling of your face, hands, ankles, feet, or legs.   You have not felt your baby move in over an hour.   You have severe headaches that do not go away with medicine.   You have vision changes.  Document Released: 04/29/2001 Document Revised: 01/05/2013 Document Reviewed: 07/06/2012  [employer] Patient Information 2014 [employer].

## 2013-06-07 NOTE — Progress Notes (Signed)
Start valtrex for suppression at 35 weeks

## 2013-06-07 NOTE — Progress Notes (Signed)
P = 94 

## 2013-06-09 ENCOUNTER — Encounter: Payer: Self-pay | Admitting: *Deleted

## 2013-06-20 ENCOUNTER — Ambulatory Visit (INDEPENDENT_AMBULATORY_CARE_PROVIDER_SITE_OTHER): Payer: Medicaid Other | Admitting: Obstetrics & Gynecology

## 2013-06-20 ENCOUNTER — Encounter: Payer: Medicaid Other | Admitting: Obstetrics & Gynecology

## 2013-06-20 VITALS — BP 119/63 | Wt 146.0 lb

## 2013-06-20 DIAGNOSIS — A6009 Herpesviral infection of other urogenital tract: Secondary | ICD-10-CM

## 2013-06-20 DIAGNOSIS — Z3480 Encounter for supervision of other normal pregnancy, unspecified trimester: Secondary | ICD-10-CM

## 2013-06-20 DIAGNOSIS — A6000 Herpesviral infection of urogenital system, unspecified: Secondary | ICD-10-CM

## 2013-06-20 DIAGNOSIS — Z3492 Encounter for supervision of normal pregnancy, unspecified, second trimester: Secondary | ICD-10-CM

## 2013-06-20 LAB — OB RESULTS CONSOLE GBS: GBS: NEGATIVE

## 2013-06-20 MED ORDER — PRENATAL 27-0.8 MG PO TABS: 1.0000 | ORAL_TABLET | Freq: Every day | ORAL | Status: DC

## 2013-06-20 MED ORDER — VALACYCLOVIR HCL 1 G PO TABS: 1000.0000 mg | ORAL_TABLET | Freq: Every day | ORAL | Status: AC

## 2013-06-20 NOTE — Progress Notes (Signed)
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 Had taken valtrex for 4 days prior, upped her dose to 100o mg. GBS, GC and CT today, lesions are regressing. /continue 1000 mg for supression

## 2013-06-20 NOTE — Progress Notes (Signed)
P - 95 - Pt states she started suppressant medication 06/13/13 and experienced an outbreak that began on 06/18/13 - Pt states the medication has helped as outbreak wasn't as severe as before but pt is concerned about the outbreak this late in pregnancy.

## 2013-06-20 NOTE — Patient Instructions (Signed)

## 2013-06-21 LAB — PRESCRIPTION MONITORING PROFILE (19 PANEL)
AMPHETAMINE/METH: NEGATIVE ng/mL
BENZODIAZEPINE SCREEN, URINE: NEGATIVE ng/mL
BUPRENORPHINE, URINE: NEGATIVE ng/mL
Barbiturate Screen, Urine: NEGATIVE ng/mL
CARISOPRODOL, URINE: NEGATIVE ng/mL
Cannabinoid Scrn, Ur: NEGATIVE ng/mL
Cocaine Metabolites: NEGATIVE ng/mL
Creatinine, Urine: 66.95 mg/dL (ref >20.0)
Fentanyl, Ur: NEGATIVE ng/mL
MDMA URINE: NEGATIVE ng/mL
MEPERIDINE UR: NEGATIVE ng/mL
METHADONE SCREEN, URINE: NEGATIVE ng/mL
METHAQUALONE SCREEN (URINE): NEGATIVE ng/mL
Nitrites, Initial: NEGATIVE ug/mL
Opiate Screen, Urine: NEGATIVE ng/mL
Oxycodone Screen, Ur: NEGATIVE ng/mL
PH URINE, INITIAL: 7.4 pH (ref 4.5–8.9)
PROPOXYPHENE: NEGATIVE ng/mL
Phencyclidine, Ur: NEGATIVE ng/mL
TAPENTADOLUR: NEGATIVE ng/mL
TRAMADOL UR: NEGATIVE ng/mL
Zolpidem, Urine: NEGATIVE ng/mL

## 2013-06-21 LAB — GC/CHLAMYDIA PROBE AMP
CT Probe RNA: NEGATIVE
GC Probe RNA: NEGATIVE

## 2013-06-22 LAB — CULTURE, BETA STREP (GROUP B ONLY)

## 2013-06-28 ENCOUNTER — Ambulatory Visit (INDEPENDENT_AMBULATORY_CARE_PROVIDER_SITE_OTHER): Payer: Medicaid Other | Admitting: Obstetrics & Gynecology

## 2013-06-28 ENCOUNTER — Encounter: Payer: Self-pay | Admitting: *Deleted

## 2013-06-28 VITALS — BP 107/58 | Wt 146.0 lb

## 2013-06-28 DIAGNOSIS — Z3480 Encounter for supervision of other normal pregnancy, unspecified trimester: Secondary | ICD-10-CM

## 2013-06-28 DIAGNOSIS — Z3492 Encounter for supervision of normal pregnancy, unspecified, second trimester: Secondary | ICD-10-CM

## 2013-06-28 NOTE — Progress Notes (Signed)
P-75 

## 2013-06-28 NOTE — Progress Notes (Signed)
Will stop work until 6 weeks PP. Sx HSV resolved

## 2013-07-05 ENCOUNTER — Encounter: Payer: Medicaid Other | Admitting: Obstetrics & Gynecology

## 2013-07-12 ENCOUNTER — Telehealth (HOSPITAL_COMMUNITY): Payer: Self-pay | Admitting: Obstetrics & Gynecology

## 2013-07-12 ENCOUNTER — Encounter: Payer: Medicaid Other | Admitting: Obstetrics & Gynecology

## 2013-07-14 ENCOUNTER — Inpatient Hospital Stay (HOSPITAL_COMMUNITY)
Admission: AD | Admit: 2013-07-14 | Discharge: 2013-07-18 | DRG: 765 | Disposition: A | Discharge status: Home / Self Care | Payer: Medicaid Other | Source: Ambulatory Visit | Attending: Obstetrics & Gynecology | Admitting: Obstetrics & Gynecology

## 2013-07-14 DIAGNOSIS — O98519 Other viral diseases complicating pregnancy, unspecified trimester: Secondary | ICD-10-CM | POA: Diagnosis present

## 2013-07-14 DIAGNOSIS — O411090 Infection of amniotic sac and membranes, unspecified, unspecified trimester, not applicable or unspecified: Secondary | ICD-10-CM | POA: Diagnosis present

## 2013-07-14 DIAGNOSIS — O4290 Premature rupture of membranes, unspecified as to length of time between rupture and onset of labor, unspecified weeks of gestation: Secondary | ICD-10-CM | POA: Diagnosis present

## 2013-07-14 DIAGNOSIS — O339 Maternal care for disproportion, unspecified: Secondary | ICD-10-CM | POA: Diagnosis present

## 2013-07-14 DIAGNOSIS — O330 Maternal care for disproportion due to deformity of maternal pelvic bones: Principal | ICD-10-CM | POA: Diagnosis present

## 2013-07-14 DIAGNOSIS — A6000 Herpesviral infection of urogenital system, unspecified: Secondary | ICD-10-CM | POA: Diagnosis present

## 2013-07-14 NOTE — MAU Note (Signed)
Leaking fluid since 1130 am, pressure in lower abd

## 2013-07-15 ENCOUNTER — Encounter (HOSPITAL_COMMUNITY): Payer: Self-pay | Admitting: *Deleted

## 2013-07-15 ENCOUNTER — Encounter (HOSPITAL_COMMUNITY): Payer: Medicaid Other | Admitting: Anesthesiology

## 2013-07-15 ENCOUNTER — Inpatient Hospital Stay (HOSPITAL_COMMUNITY): Payer: Medicaid Other | Admitting: Anesthesiology

## 2013-07-15 DIAGNOSIS — O4290 Premature rupture of membranes, unspecified as to length of time between rupture and onset of labor, unspecified weeks of gestation: Secondary | ICD-10-CM | POA: Diagnosis present

## 2013-07-15 LAB — CBC
HEMATOCRIT: 30.3 % — AB (ref 36.0–46.0)
HEMOGLOBIN: 10 g/dL — AB (ref 12.0–15.0)
MCH: 28.1 pg (ref 26.0–34.0)
MCHC: 33 g/dL (ref 30.0–36.0)
MCV: 85.1 fL (ref 78.0–100.0)
Platelets: 291 10*3/uL (ref 150–400)
RBC: 3.56 MIL/uL — AB (ref 3.87–5.11)
RDW: 14.5 % (ref 11.5–15.5)
WBC: 15.8 10*3/uL — ABNORMAL HIGH (ref 4.0–10.5)

## 2013-07-15 LAB — POCT FERN TEST: POCT Fern Test: POSITIVE

## 2013-07-15 LAB — RPR: RPR: NONREACTIVE

## 2013-07-15 MED ORDER — TERBUTALINE SULFATE 1 MG/ML IJ SOLN: 0.2500 mg | Freq: Once | INTRAMUSCULAR | Status: AC | PRN

## 2013-07-15 MED ORDER — PHENYLEPHRINE 40 MCG/ML (10ML) SYRINGE FOR IV PUSH (FOR BLOOD PRESSURE SUPPORT): 80.0000 ug | PREFILLED_SYRINGE | INTRAVENOUS | Status: DC | PRN

## 2013-07-15 MED ORDER — NALBUPHINE HCL 10 MG/ML IJ SOLN
10.0000 mg | INTRAMUSCULAR | Status: DC | PRN
  Administered 2013-07-15 (×3): 10 mg via INTRAVENOUS
  Filled 2013-07-15 (×3): qty 1

## 2013-07-15 MED ORDER — MISOPROSTOL 200 MCG PO TABS
50.0000 ug | ORAL_TABLET | ORAL | Status: DC
  Administered 2013-07-15: 50 ug via ORAL
  Filled 2013-07-15: qty 0.5

## 2013-07-15 MED ORDER — LIDOCAINE HCL (PF) 1 % IJ SOLN: 30.0000 mL | INTRAMUSCULAR | Status: DC | PRN

## 2013-07-15 MED ORDER — NALBUPHINE HCL 10 MG/ML IJ SOLN: 10.0000 mg | INTRAMUSCULAR | Status: DC | PRN

## 2013-07-15 MED ORDER — FENTANYL 2.5 MCG/ML BUPIVACAINE 1/10 % EPIDURAL INFUSION (WH - ANES)
INTRAMUSCULAR | Status: DC | PRN
  Administered 2013-07-15: 14 mL/h via EPIDURAL

## 2013-07-15 MED ORDER — CITRIC ACID-SODIUM CITRATE 334-500 MG/5ML PO SOLN
30.0000 mL | ORAL | Status: DC | PRN
  Administered 2013-07-16 (×2): 30 mL via ORAL
  Filled 2013-07-15: qty 15

## 2013-07-15 MED ORDER — DIPHENHYDRAMINE HCL 50 MG/ML IJ SOLN: 12.5000 mg | INTRAMUSCULAR | Status: DC | PRN

## 2013-07-15 MED ORDER — OXYTOCIN 40 UNITS IN LACTATED RINGERS INFUSION - SIMPLE MED: 62.5000 mL/h | INTRAVENOUS | Status: DC

## 2013-07-15 MED ORDER — LACTATED RINGERS IV SOLN
500.0000 mL | Freq: Once | INTRAVENOUS | Status: AC
  Administered 2013-07-15: 500 mL via INTRAVENOUS

## 2013-07-15 MED ORDER — FENTANYL 2.5 MCG/ML BUPIVACAINE 1/10 % EPIDURAL INFUSION (WH - ANES)
14.0000 mL/h | INTRAMUSCULAR | Status: DC | PRN
Start: 2013-07-15 — End: 2013-07-16
  Administered 2013-07-16: 14 mL/h via EPIDURAL
  Filled 2013-07-15 (×3): qty 125

## 2013-07-15 MED ORDER — ONDANSETRON HCL 4 MG/2ML IJ SOLN
4.0000 mg | Freq: Four times a day (QID) | INTRAMUSCULAR | Status: DC | PRN
  Filled 2013-07-15: qty 2

## 2013-07-15 MED ORDER — NALBUPHINE HCL 10 MG/ML IJ SOLN
10.0000 mg | INTRAMUSCULAR | Status: DC | PRN
  Administered 2013-07-15: 10 mg via INTRAVENOUS
  Filled 2013-07-15: qty 1

## 2013-07-15 MED ORDER — LACTATED RINGERS IV SOLN
500.0000 mL | INTRAVENOUS | Status: DC | PRN
  Administered 2013-07-15 – 2013-07-16 (×3): 500 mL via INTRAVENOUS
  Administered 2013-07-16 (×3): 300 mL via INTRAVENOUS

## 2013-07-15 MED ORDER — PHENYLEPHRINE 40 MCG/ML (10ML) SYRINGE FOR IV PUSH (FOR BLOOD PRESSURE SUPPORT)
80.0000 ug | PREFILLED_SYRINGE | INTRAVENOUS | Status: DC | PRN
  Filled 2013-07-15 (×3): qty 10

## 2013-07-15 MED ORDER — LACTATED RINGERS IV SOLN
INTRAVENOUS | Status: DC
  Administered 2013-07-15 – 2013-07-16 (×4): via INTRAVENOUS

## 2013-07-15 MED ORDER — OXYTOCIN 40 UNITS IN LACTATED RINGERS INFUSION - SIMPLE MED
1.0000 m[IU]/min | INTRAVENOUS | Status: DC
  Administered 2013-07-15: 2 m[IU]/min via INTRAVENOUS
  Filled 2013-07-15: qty 1000

## 2013-07-15 MED ORDER — LACTATED RINGERS IV SOLN
INTRAVENOUS | Status: DC
  Administered 2013-07-15: 1000 mL via INTRAUTERINE
  Administered 2013-07-16: 15:00:00 via INTRAUTERINE
  Administered 2013-07-16: 1000 mL via INTRAUTERINE
  Administered 2013-07-16 (×2): via INTRAUTERINE

## 2013-07-15 MED ORDER — MISOPROSTOL 25 MCG QUARTER TABLET
50.0000 ug | ORAL_TABLET | ORAL | Status: DC | PRN
  Administered 2013-07-15: 50 ug via ORAL
  Filled 2013-07-15: qty 0.5

## 2013-07-15 MED ORDER — LIDOCAINE HCL (PF) 1 % IJ SOLN
INTRAMUSCULAR | Status: DC | PRN
  Administered 2013-07-15 (×2): 9 mL

## 2013-07-15 MED ORDER — TERBUTALINE SULFATE 1 MG/ML IJ SOLN
0.2500 mg | Freq: Once | INTRAMUSCULAR | Status: AC | PRN
  Filled 2013-07-15: qty 1

## 2013-07-15 MED ORDER — EPHEDRINE 5 MG/ML INJ
10.0000 mg | INTRAVENOUS | Status: DC | PRN
  Filled 2013-07-15 (×2): qty 4

## 2013-07-15 MED ORDER — EPHEDRINE 5 MG/ML INJ
10.0000 mg | INTRAVENOUS | Status: DC | PRN
  Filled 2013-07-15: qty 4

## 2013-07-15 MED ORDER — OXYTOCIN BOLUS FROM INFUSION: 500.0000 mL | INTRAVENOUS | Status: DC

## 2013-07-15 MED ORDER — IBUPROFEN 600 MG PO TABS: 600.0000 mg | ORAL_TABLET | Freq: Four times a day (QID) | ORAL | Status: DC | PRN

## 2013-07-15 MED ORDER — OXYCODONE-ACETAMINOPHEN 5-325 MG PO TABS: 1.0000 | ORAL_TABLET | ORAL | Status: DC | PRN

## 2013-07-15 MED ORDER — ACETAMINOPHEN 325 MG PO TABS: 650.0000 mg | ORAL_TABLET | ORAL | Status: DC | PRN

## 2013-07-15 NOTE — Anesthesia Procedure Notes (Signed)
Epidural Patient location during procedure: OB Start time: 07/15/2013 8:59 PM End time: 07/15/2013 9:03 PM  Staffing Anesthesiologist: Leilani AbleHATCHETT, Pennelope Basque  Preanesthetic Checklist Completed: patient identified, surgical consent, pre-op evaluation, timeout performed, IV checked, risks and benefits discussed and monitors and equipment checked  Epidural Patient position: sitting Prep: site prepped and draped and DuraPrep Patient monitoring: continuous pulse ox and blood pressure Approach: midline Injection technique: LOR air  Needle:  Needle type: Tuohy  Needle gauge: 17 G Needle length: 9 cm and 9 Needle insertion depth: 5 cm cm Catheter type: closed end flexible Catheter size: 19 Gauge Catheter at skin depth: 10 cm Test dose: negative and Other  Assessment Sensory level: T9 Events: blood not aspirated, injection not painful, no injection resistance, negative IV test and no paresthesia  Additional Notes Reason for block:procedure for pain

## 2013-07-15 NOTE — Progress Notes (Signed)
Ariel Edwards is a 23 y.o. G1P0 at 726w3d admitted for PROM  Subjective:  Doing well. Still cramping. FB just came out. +FM  Objective: BP 113/56  Pulse 75  Temp(Src) 98.1 F (36.7 C) (Oral)  Resp 20  Ht 5\' 2"  (1.575 m)  Wt 69.854 kg (154 lb)  BMI 28.16 kg/m2  SpO2 100%      FHT:  FHR: 135 bpm, variability: moderate,  accelerations:  Present,  decelerations:  Absent UC:   irregular SVE:   Dilation: 2.5 Effacement (%): 50 Station: -2 Exam by:: dr Reola Calkinsbeck  Labs: Lab Results  Component Value Date   WBC 15.8* 07/15/2013   HGB 10.0* 07/15/2013   HCT 30.3* 07/15/2013   MCV 85.1 07/15/2013   PLT 291 07/15/2013    Assessment / Plan: IOL for PROM  Labor: FB out. will start pit 2x2 Fetal Wellbeing:  Category I Pain Control:  Epidural I/D:  n/a Anticipated MOD:  NSVD  Havier Deeb L 07/15/2013, 7:11 PM

## 2013-07-15 NOTE — Progress Notes (Signed)
Ariel Edwards is a 23 y.o. G1P0 at 2331w3d by ultrasound admitted for PROM  Subjective: She is doing well. Contractions strength continues to increase and pain still controlled by IV medication.   Objective: BP 115/57  Pulse 78  Temp(Src) 97.9 F (36.6 C) (Oral)  Resp 20  Ht 5\' 2"  (1.575 m)  Wt 69.854 kg (154 lb)  BMI 28.16 kg/m2  SpO2 100%     FHT:  FHR: 150 bpm, variability: moderate,  accelerations:  Present,  decelerations:  Absent UC:   regular, every 3-5 minutes SVE:   Dilation: 2.5 Effacement (%): 50 Station: -2 Exam by:: dr Reola Calkinsbeck  Labs: Lab Results  Component Value Date   WBC 15.8* 07/15/2013   HGB 10.0* 07/15/2013   HCT 30.3* 07/15/2013   MCV 85.1 07/15/2013   PLT 291 07/15/2013    Assessment / Plan: Induction of labor due to PROM,  progressing well on pitocin  Labor: FB still in place Fetal Wellbeing:  Category I Pain Control:  Nubain I/D:  n/a Anticipated MOD:  NSVD  Wenda LowJoyner, Mackie Holness 07/15/2013, 4:24 PM

## 2013-07-15 NOTE — Progress Notes (Signed)
Ariel Edwards is a 23 y.o. G1P0 at 652w3d admitted for PROM  Subjective: Having some cramping on and off. +FM.   Objective: BP 118/44  Pulse 77  Temp(Src) 97.9 F (36.6 C) (Oral)  Resp 20  Ht 5\' 2"  (1.575 m)  Wt 69.854 kg (154 lb)  BMI 28.16 kg/m2  SpO2 100%      FHT:  FHR: 135 bpm, variability: moderate,  accelerations:  Present,  decelerations:  Absent UC:   Difficult to trace.   SVE:   Dilation: 2.5 Effacement (%): 50 Station: -2 Exam by:: dr Reola Calkinsbeck  Labs: Lab Results  Component Value Date   WBC 15.8* 07/15/2013   HGB 10.0* 07/15/2013   HCT 30.3* 07/15/2013   MCV 85.1 07/15/2013   PLT 291 07/15/2013    Assessment / Plan: IOL for prom  Labor: FB placed and inflated with 60cc of LR Fetal Wellbeing:  Category I Pain Control:  nubaine I/D:  n/a Anticipated MOD:  NSVD  Ariel Edwards L 07/15/2013, 12:35 PM

## 2013-07-15 NOTE — Progress Notes (Signed)
   Marikay AlarDiana Provencal is a [age] y.o. G1P0 at 4545w3d  admitted for PROM at 1130 2/26  Subjective: Contractions on occasion  Objective: BP 112/50  Pulse 82  Temp(Src) 98.5 F (36.9 C) (Oral)  Resp 18  Ht 5\' 2"  (1.575 m)  Wt 69.854 kg (154 lb)  BMI 28.16 kg/m2  SpO2 100%    FHT:  FHR: 140 bpm, variability: moderate,  accelerations:  Present,  decelerations:  Absent UC:   rare SVE:   Dilation: 1 Exam by:: T. Braimang,CNM student   Labs: Lab Results  Component Value Date   WBC 15.8* 07/15/2013   HGB 10.0* 07/15/2013   HCT 30.3* 07/15/2013   MCV 85.1 07/15/2013   PLT 291 07/15/2013    Assessment / Plan: cervical ripening for PROM Repeat oral cytotec Labor: no Fetal Wellbeing:  Category I Pain Control:  Labor support without medications Anticipated MOD:  NSVD  CRESENZO-DISHMAN,Kyuss Hale 07/15/2013, 7:52 AM

## 2013-07-15 NOTE — Anesthesia Preprocedure Evaluation (Addendum)
Anesthesia Evaluation  Patient identified by MRN, date of birth, ID band Patient awake    Reviewed: Allergy & Precautions, H&P , NPO status , Patient's Chart, lab work & pertinent test results  Airway Mallampati: I TM Distance: >3 FB Neck ROM: full    Dental no notable dental hx.    Pulmonary former smoker,  breath sounds clear to auscultation  Pulmonary exam normal       Cardiovascular negative cardio ROS      Neuro/Psych negative neurological ROS     GI/Hepatic negative GI ROS, Neg liver ROS,   Endo/Other  negative endocrine ROS  Renal/GU negative Renal ROS  negative genitourinary   Musculoskeletal   Abdominal Normal abdominal exam  (+)   Peds  Hematology negative hematology ROS (+)   Anesthesia Other Findings   Reproductive/Obstetrics (+) Pregnancy                          Anesthesia Physical Anesthesia Plan  ASA: II and emergent  Anesthesia Plan: Epidural   Post-op Pain Management:    Induction:   Airway Management Planned:   Additional Equipment:   Intra-op Plan:   Post-operative Plan:   Informed Consent: I have reviewed the patients History and Physical, chart, labs and discussed the procedure including the risks, benefits and alternatives for the proposed anesthesia with the patient or authorized representative who has indicated his/her understanding and acceptance.     Plan Discussed with: CRNA, Anesthesiologist and Surgeon  Anesthesia Plan Comments: (Patient for C/Section for CPD. Will use epidural for C/Section. )       Anesthesia Quick Evaluation

## 2013-07-15 NOTE — Progress Notes (Signed)
Marikay AlarDiana Geraldo is a 23 y.o. G1P0 at 4196w3d   Subjective: Just got epidural; comfortable; leaking colostrum from breasts  Objective: BP 120/65  Pulse 84  Temp(Src) 98 F (36.7 C) (Oral)  Resp 20  Ht 5\' 2"  (1.575 m)  Wt 69.854 kg (154 lb)  BMI 28.16 kg/m2  SpO2 99%      FHT:  FHR: 130 bpm, variability: moderate,  accelerations:  Present,  decelerations:  Absent- early variables present UC:   irregular, every 4-7 minutes with 736mu/min Pitocin SVE:   Dilation: 4 Effacement (%): 60 Station: -2 Exam by:: dr Reola CalkinsBeck- exam deferred currently  Labs: Lab Results  Component Value Date   WBC 15.8* 07/15/2013   HGB 10.0* 07/15/2013   HCT 30.3* 07/15/2013   MCV 85.1 07/15/2013   PLT 291 07/15/2013    Assessment / Plan: IOL process Prolonged ROM x 35+hrs- afebrile  Continue to increase Pitocin to achieve adequate ctx  Cam HaiSHAW, KIMBERLY 07/15/2013, 10:21 PM

## 2013-07-15 NOTE — Progress Notes (Signed)
Ariel Edwards is a 23 y.o. G1P0 at 5771w3d   Subjective: Feeling some sharp pains and pressure.  Objective: BP 111/55  Pulse 70  Temp(Src) 98 F (36.7 C) (Oral)  Resp 20  Ht 5\' 2"  (1.575 m)  Wt 69.854 kg (154 lb)  BMI 28.16 kg/m2  SpO2 99%      FHT:  FHR: 140 bpm, variability: moderate,  accelerations:  Present,  decelerations:  Present early decels-  UC:   regular, every 2-3 minutes with 546mu/min Pitocin SVE:   Dilation: 5 Effacement (%): 80 Station: -1 Exam by:: Ace GinsL. Cresenzo, RN-  Labs: Lab Results  Component Value Date   WBC 15.8* 07/15/2013   HGB 10.0* 07/15/2013   HCT 30.3* 07/15/2013   MCV 85.1 07/15/2013   PLT 291 07/15/2013    Assessment / Plan: IOL process due to Prolonged ROM x 35+hrs- afebrile  Labor: progressing normally, Placed IUPC as contractions were difficult to monitor and need to titrate pitocin with early decels, start Amnioinfusion for repetetive early decels. FWB: Category 2 strip Pain: epidural ID: GBS negative MOD: anticipate NSVD   Kevin FentonBradshaw, Samuel 07/15/2013, 11:04 PM   Early decels continued but with good variability, Will stop pitocin for an hour and restart 2X2 then monitor fetal strip closely.   Murtis SinkSam Bradshaw, MD [facility] Resident, PGY-2 07/15/2013, 11:47 PM  I have seen and examined this patient and I agree with the above. SHAW, KIMBERLY 1:48 AM 07/16/2013

## 2013-07-15 NOTE — H&P (Signed)
Ariel Edwards is a 23 y.o. female presenting for PROM since 11:30am on 07/14/13. . Maternal Medical History:  Reason for admission: Rupture of membranes and contractions.   Contractions: Onset was 13-24 hours ago.   Frequency: irregular.   Perceived severity is mild.    Fetal activity: Perceived fetal activity is normal.   Last perceived fetal movement was within the past hour.    Prenatal complications: no prenatal complications Prenatal Complications - Diabetes: none.    OB History   Grav Para Term Preterm Abortions TAB SAB Ect Mult Living   1              Past Medical History  Diagnosis Date  . Depression   . Anxiety   . HSV (herpes simplex virus) infection    Past Surgical History  Procedure Laterality Date  . Wisdom tooth extraction     Family History: family history includes Breast cancer in her maternal grandmother and paternal grandmother; Hypertension in her mother; Ovarian cancer in her maternal grandmother and paternal grandmother. Social History:  reports that she has never smoked. She has never used smokeless tobacco. She reports that she uses illicit drugs (Marijuana). She reports that she does not drink alcohol.   Prenatal Transfer Tool  Maternal Diabetes: No Genetic Screening: Normal Maternal Ultrasounds/Referrals: Normal Fetal Ultrasounds or other Referrals:  None Maternal Substance Abuse:  No Significant Maternal Medications:  Meds include: Protonix Other:  Significant Maternal Lab Results:  None Other Comments:  None  Review of Systems  Constitutional: Negative.   HENT: Negative.   Eyes: Negative.   Respiratory: Negative.   Cardiovascular: Negative.   Gastrointestinal: Positive for abdominal pain. Negative for heartburn.  Genitourinary: Negative.   Musculoskeletal: Negative.   Skin: Negative.   Neurological: Negative.   Endo/Heme/Allergies: Negative.   Psychiatric/Behavioral: Negative.       Blood pressure 96/79, pulse 74, temperature  98.2 F (36.8 C), temperature source Oral, resp. rate 18, height 5\' 2"  (1.575 m), weight 69.854 kg (154 lb), SpO2 100.00%. Maternal Exam:  Uterine Assessment: Contraction strength is mild.  Contraction duration is 1 minute. Contraction frequency is irregular.   Abdomen: Patient reports no abdominal tenderness. Fetal presentation: vertex  Introitus: Normal vulva. Vagina is positive for vaginal discharge.  Ferning test: positive.  Nitrazine test: not done. Amniotic fluid character: clear.  Pelvis: adequate for delivery.   Cervix: Cervix evaluated by sterile speculum exam and digital exam.     Fetal Exam Fetal Monitor Review: Mode: ultrasound.   Baseline rate: 150.  Variability: moderate (6-25 bpm).   Pattern: no decelerations and accelerations present.    Fetal State Assessment: Category I - tracings are normal.     Physical Exam  Constitutional: She is oriented to person, place, and time. She appears well-developed and well-nourished.  HENT:  Head: Normocephalic and atraumatic.  Neck: Neck supple.  Cardiovascular: Normal rate, regular rhythm and normal heart sounds.   Respiratory: Effort normal and breath sounds normal.  GI: Soft. Bowel sounds are normal.  Genitourinary: Uterus normal. Vaginal discharge found.  Gravid uterus  Musculoskeletal: Normal range of motion.  Neurological: She is alert and oriented to person, place, and time. She has normal reflexes.  Skin: Skin is warm and dry.  Psychiatric: She has a normal mood and affect. Her behavior is normal. Thought content normal.    Prenatal labs: ABO, Rh: A/Positive/-- (07/07 0000) Antibody: Negative (07/07 0000) Rubella:   RPR: NON REAC (11/18 1317)  HBsAg: Negative (07/07 0000)  HIV:  NON REACTIVE (11/18 1317)  GBS: Negative (02/02 0000)   Assessment/Plan: 23 yo G1P0 with SIUP at [redacted]w[redacted]d  PROM  Admit to birthing suites IOL with oral cytotec Anticipate NSVD   Selena Lesser 07/15/2013, 1:24 AM  I have seen  and examined this patient and agree the above assessment. CRESENZO-DISHMAN,Jaevon Paras 07/15/2013 10:17 AM

## 2013-07-16 ENCOUNTER — Encounter (HOSPITAL_COMMUNITY)
Admission: AD | Disposition: A | Discharge status: Home / Self Care | Payer: Self-pay | Source: Ambulatory Visit | Attending: Obstetrics & Gynecology

## 2013-07-16 ENCOUNTER — Encounter (HOSPITAL_COMMUNITY): Payer: Self-pay

## 2013-07-16 DIAGNOSIS — O339 Maternal care for disproportion, unspecified: Secondary | ICD-10-CM

## 2013-07-16 DIAGNOSIS — O411090 Infection of amniotic sac and membranes, unspecified, unspecified trimester, not applicable or unspecified: Secondary | ICD-10-CM

## 2013-07-16 DIAGNOSIS — O330 Maternal care for disproportion due to deformity of maternal pelvic bones: Secondary | ICD-10-CM

## 2013-07-16 DIAGNOSIS — O98519 Other viral diseases complicating pregnancy, unspecified trimester: Secondary | ICD-10-CM

## 2013-07-16 LAB — TYPE AND SCREEN
ABO/RH(D): A POS
Antibody Screen: NEGATIVE

## 2013-07-16 LAB — ABO/RH: ABO/RH(D): A POS

## 2013-07-16 SURGERY — Surgical Case
Anesthesia: Epidural | Site: Abdomen

## 2013-07-16 MED ORDER — MORPHINE SULFATE (PF) 0.5 MG/ML IJ SOLN
INTRAMUSCULAR | Status: DC | PRN
  Administered 2013-07-16: 5 mg via EPIDURAL
  Administered 2013-07-16: 2 mg via INTRAVENOUS
  Administered 2013-07-16: 3 mg via INTRAVENOUS

## 2013-07-16 MED ORDER — TERBUTALINE SULFATE 1 MG/ML IJ SOLN
0.2500 mg | Freq: Once | INTRAMUSCULAR | Status: AC
  Administered 2013-07-16: 0.25 mg via SUBCUTANEOUS

## 2013-07-16 MED ORDER — OXYTOCIN 10 UNIT/ML IJ SOLN
INTRAMUSCULAR | Status: AC
  Filled 2013-07-16: qty 4

## 2013-07-16 MED ORDER — KETOROLAC TROMETHAMINE 30 MG/ML IJ SOLN: 30.0000 mg | Freq: Four times a day (QID) | INTRAMUSCULAR | Status: DC | PRN

## 2013-07-16 MED ORDER — NALBUPHINE HCL 10 MG/ML IJ SOLN: 5.0000 mg | INTRAMUSCULAR | Status: DC | PRN

## 2013-07-16 MED ORDER — LANOLIN HYDROUS EX OINT: 1.0000 "application " | TOPICAL_OINTMENT | CUTANEOUS | Status: DC | PRN

## 2013-07-16 MED ORDER — OXYTOCIN 40 UNITS IN LACTATED RINGERS INFUSION - SIMPLE MED: 62.5000 mL/h | INTRAVENOUS | Status: AC

## 2013-07-16 MED ORDER — HYDROMORPHONE HCL PF 1 MG/ML IJ SOLN
1.0000 mg | INTRAMUSCULAR | Status: DC | PRN
  Administered 2013-07-16 – 2013-07-17 (×3): 1 mg via INTRAVENOUS
  Filled 2013-07-16 (×3): qty 1

## 2013-07-16 MED ORDER — SCOPOLAMINE 1 MG/3DAYS TD PT72
1.0000 | MEDICATED_PATCH | Freq: Once | TRANSDERMAL | Status: DC
  Administered 2013-07-16: 1.5 mg via TRANSDERMAL

## 2013-07-16 MED ORDER — MORPHINE SULFATE 0.5 MG/ML IJ SOLN
INTRAMUSCULAR | Status: AC
  Filled 2013-07-16: qty 10

## 2013-07-16 MED ORDER — LACTATED RINGERS IV SOLN
INTRAVENOUS | Status: DC
  Administered 2013-07-17: 03:00:00 via INTRAVENOUS

## 2013-07-16 MED ORDER — DIBUCAINE 1 % RE OINT: 1.0000 "application " | TOPICAL_OINTMENT | RECTAL | Status: DC | PRN

## 2013-07-16 MED ORDER — PRENATAL MULTIVITAMIN CH
1.0000 | ORAL_TABLET | Freq: Every day | ORAL | Status: DC
  Administered 2013-07-17 – 2013-07-18 (×2): 1 via ORAL
  Filled 2013-07-16 (×2): qty 1

## 2013-07-16 MED ORDER — FENTANYL CITRATE 0.05 MG/ML IJ SOLN
INTRAMUSCULAR | Status: AC
  Administered 2013-07-16: 50 ug via INTRAVENOUS
  Filled 2013-07-16: qty 2

## 2013-07-16 MED ORDER — METOCLOPRAMIDE HCL 5 MG/ML IJ SOLN: 10.0000 mg | Freq: Three times a day (TID) | INTRAMUSCULAR | Status: DC | PRN

## 2013-07-16 MED ORDER — KETOROLAC TROMETHAMINE 30 MG/ML IJ SOLN
30.0000 mg | Freq: Four times a day (QID) | INTRAMUSCULAR | Status: DC | PRN
  Administered 2013-07-16: 30 mg via INTRAVENOUS

## 2013-07-16 MED ORDER — SCOPOLAMINE 1 MG/3DAYS TD PT72
MEDICATED_PATCH | TRANSDERMAL | Status: AC
  Administered 2013-07-16: 1.5 mg via TRANSDERMAL
  Filled 2013-07-16: qty 1

## 2013-07-16 MED ORDER — ONDANSETRON HCL 4 MG PO TABS
4.0000 mg | ORAL_TABLET | ORAL | Status: DC | PRN
Start: 2013-07-16 — End: 2013-07-18

## 2013-07-16 MED ORDER — ONDANSETRON HCL 4 MG/2ML IJ SOLN: 4.0000 mg | Freq: Three times a day (TID) | INTRAMUSCULAR | Status: DC | PRN

## 2013-07-16 MED ORDER — KETOROLAC TROMETHAMINE 30 MG/ML IJ SOLN
INTRAMUSCULAR | Status: AC
Start: 2013-07-16 — End: 2013-07-17
  Filled 2013-07-16: qty 1

## 2013-07-16 MED ORDER — BUPIVACAINE HCL 0.25 % IJ SOLN
INTRAMUSCULAR | Status: DC | PRN
  Administered 2013-07-16: 16 mL
  Administered 2013-07-16: 14 mL

## 2013-07-16 MED ORDER — SIMETHICONE 80 MG PO CHEW: 80.0000 mg | CHEWABLE_TABLET | ORAL | Status: DC | PRN

## 2013-07-16 MED ORDER — MEPERIDINE HCL 25 MG/ML IJ SOLN: 6.2500 mg | INTRAMUSCULAR | Status: DC | PRN

## 2013-07-16 MED ORDER — AMPICILLIN SODIUM 2 G IJ SOLR
2.0000 g | Freq: Four times a day (QID) | INTRAMUSCULAR | Status: DC
  Administered 2013-07-16: 2 g via INTRAVENOUS
  Filled 2013-07-16 (×4): qty 2000

## 2013-07-16 MED ORDER — DIPHENHYDRAMINE HCL 25 MG PO CAPS
25.0000 mg | ORAL_CAPSULE | ORAL | Status: DC | PRN
  Administered 2013-07-17: 25 mg via ORAL
  Filled 2013-07-16: qty 1

## 2013-07-16 MED ORDER — CEFAZOLIN SODIUM-DEXTROSE 2-3 GM-% IV SOLR
INTRAVENOUS | Status: DC | PRN
  Administered 2013-07-16: 2 g via INTRAVENOUS

## 2013-07-16 MED ORDER — SIMETHICONE 80 MG PO CHEW
80.0000 mg | CHEWABLE_TABLET | ORAL | Status: DC
  Administered 2013-07-18: 80 mg via ORAL
  Filled 2013-07-16 (×2): qty 1

## 2013-07-16 MED ORDER — GENTAMICIN SULFATE 40 MG/ML IJ SOLN
150.0000 mg | Freq: Three times a day (TID) | INTRAVENOUS | Status: DC
  Filled 2013-07-16 (×2): qty 3.75

## 2013-07-16 MED ORDER — SODIUM CHLORIDE 0.9 % IJ SOLN: 3.0000 mL | INTRAMUSCULAR | Status: DC | PRN

## 2013-07-16 MED ORDER — OXYCODONE-ACETAMINOPHEN 5-325 MG PO TABS
1.0000 | ORAL_TABLET | ORAL | Status: DC | PRN
  Administered 2013-07-17 – 2013-07-18 (×8): 2 via ORAL
  Filled 2013-07-16 (×9): qty 2

## 2013-07-16 MED ORDER — ONDANSETRON HCL 4 MG/2ML IJ SOLN: 4.0000 mg | INTRAMUSCULAR | Status: DC | PRN

## 2013-07-16 MED ORDER — SENNOSIDES-DOCUSATE SODIUM 8.6-50 MG PO TABS
2.0000 | ORAL_TABLET | ORAL | Status: DC
  Administered 2013-07-17 – 2013-07-18 (×2): 2 via ORAL
  Filled 2013-07-16 (×2): qty 2

## 2013-07-16 MED ORDER — OXYTOCIN 10 UNIT/ML IJ SOLN
40.0000 [IU] | INTRAMUSCULAR | Status: DC | PRN
  Administered 2013-07-16: 40 [IU] via INTRAVENOUS

## 2013-07-16 MED ORDER — GENTAMICIN SULFATE 40 MG/ML IJ SOLN
160.0000 mg | Freq: Once | INTRAVENOUS | Status: AC
  Administered 2013-07-16: 160 mg via INTRAVENOUS
  Filled 2013-07-16: qty 4

## 2013-07-16 MED ORDER — FENTANYL CITRATE 0.05 MG/ML IJ SOLN
INTRAMUSCULAR | Status: AC
  Filled 2013-07-16: qty 2

## 2013-07-16 MED ORDER — MENTHOL 3 MG MT LOZG: 1.0000 | LOZENGE | OROMUCOSAL | Status: DC | PRN

## 2013-07-16 MED ORDER — DIPHENHYDRAMINE HCL 25 MG PO CAPS
25.0000 mg | ORAL_CAPSULE | Freq: Four times a day (QID) | ORAL | Status: DC | PRN
Start: 2013-07-16 — End: 2013-07-18

## 2013-07-16 MED ORDER — ACETAMINOPHEN 500 MG PO TABS
1000.0000 mg | ORAL_TABLET | Freq: Four times a day (QID) | ORAL | Status: DC | PRN
  Administered 2013-07-16: 1000 mg via ORAL
  Filled 2013-07-16: qty 2

## 2013-07-16 MED ORDER — DIPHENHYDRAMINE HCL 50 MG/ML IJ SOLN: 25.0000 mg | INTRAMUSCULAR | Status: DC | PRN

## 2013-07-16 MED ORDER — TETANUS-DIPHTH-ACELL PERTUSSIS 5-2.5-18.5 LF-MCG/0.5 IM SUSP: 0.5000 mL | Freq: Once | INTRAMUSCULAR | Status: DC

## 2013-07-16 MED ORDER — NALBUPHINE HCL 10 MG/ML IJ SOLN
5.0000 mg | INTRAMUSCULAR | Status: DC | PRN
  Administered 2013-07-16 – 2013-07-17 (×2): 10 mg via INTRAVENOUS
  Filled 2013-07-16 (×3): qty 1

## 2013-07-16 MED ORDER — DEXTROSE 5 % IV SOLN
1.0000 ug/kg/h | INTRAVENOUS | Status: DC | PRN
  Filled 2013-07-16: qty 2

## 2013-07-16 MED ORDER — WITCH HAZEL-GLYCERIN EX PADS: 1.0000 "application " | MEDICATED_PAD | CUTANEOUS | Status: DC | PRN

## 2013-07-16 MED ORDER — NALOXONE HCL 0.4 MG/ML IJ SOLN: 0.4000 mg | INTRAMUSCULAR | Status: DC | PRN

## 2013-07-16 MED ORDER — TERBUTALINE SULFATE 1 MG/ML IJ SOLN
INTRAMUSCULAR | Status: AC
  Filled 2013-07-16: qty 1

## 2013-07-16 MED ORDER — ZOLPIDEM TARTRATE 5 MG PO TABS: 5.0000 mg | ORAL_TABLET | Freq: Every evening | ORAL | Status: DC | PRN

## 2013-07-16 MED ORDER — SODIUM BICARBONATE 8.4 % IV SOLN
INTRAVENOUS | Status: DC | PRN
  Administered 2013-07-16 (×5): 5 mL via EPIDURAL
  Administered 2013-07-16: 7 mL via EPIDURAL
  Administered 2013-07-16 (×2): 5 mL via EPIDURAL
  Administered 2013-07-16: 7 mL via EPIDURAL
  Administered 2013-07-16: 5 mL via EPIDURAL

## 2013-07-16 MED ORDER — SIMETHICONE 80 MG PO CHEW
80.0000 mg | CHEWABLE_TABLET | Freq: Three times a day (TID) | ORAL | Status: DC
  Administered 2013-07-17 – 2013-07-18 (×4): 80 mg via ORAL
  Filled 2013-07-16 (×4): qty 1

## 2013-07-16 MED ORDER — FENTANYL CITRATE 0.05 MG/ML IJ SOLN
INTRAMUSCULAR | Status: DC | PRN
  Administered 2013-07-16 (×2): 50 ug via INTRAVENOUS

## 2013-07-16 MED ORDER — DIPHENHYDRAMINE HCL 50 MG/ML IJ SOLN: 12.5000 mg | INTRAMUSCULAR | Status: DC | PRN

## 2013-07-16 MED ORDER — IBUPROFEN 600 MG PO TABS
600.0000 mg | ORAL_TABLET | Freq: Four times a day (QID) | ORAL | Status: DC
  Administered 2013-07-17 – 2013-07-18 (×7): 600 mg via ORAL
  Filled 2013-07-16 (×7): qty 1

## 2013-07-16 MED ORDER — FENTANYL CITRATE 0.05 MG/ML IJ SOLN
25.0000 ug | INTRAMUSCULAR | Status: DC | PRN
  Administered 2013-07-16: 50 ug via INTRAVENOUS
  Administered 2013-07-16: 25 ug via INTRAVENOUS

## 2013-07-16 MED ORDER — ONDANSETRON HCL 4 MG/2ML IJ SOLN
INTRAMUSCULAR | Status: AC
  Filled 2013-07-16: qty 2

## 2013-07-16 MED ORDER — ONDANSETRON HCL 4 MG/2ML IJ SOLN
INTRAMUSCULAR | Status: DC | PRN
  Administered 2013-07-16: 4 mg via INTRAVENOUS

## 2013-07-16 SURGICAL SUPPLY — 32 items
BENZOIN TINCTURE PRP APPL 2/3 (GAUZE/BANDAGES/DRESSINGS) ×2 IMPLANT
CLAMP CORD UMBIL (MISCELLANEOUS) IMPLANT
CLOTH BEACON ORANGE TIMEOUT ST (SAFETY) ×2 IMPLANT
DRAPE LG THREE QUARTER DISP (DRAPES) IMPLANT
DRSG OPSITE POSTOP 4X10 (GAUZE/BANDAGES/DRESSINGS) ×2 IMPLANT
DURAPREP 26ML APPLICATOR (WOUND CARE) ×2 IMPLANT
ELECT REM PT RETURN 9FT ADLT (ELECTROSURGICAL) ×2
ELECTRODE REM PT RTRN 9FT ADLT (ELECTROSURGICAL) ×1 IMPLANT
EXTRACTOR VACUUM KIWI (MISCELLANEOUS) IMPLANT
GLOVE BIO SURGEON ST LM GN SZ9 (GLOVE) ×2 IMPLANT
GLOVE BIOGEL PI IND STRL 9 (GLOVE) ×1 IMPLANT
GLOVE BIOGEL PI INDICATOR 9 (GLOVE) ×1
GOWN STRL REUS W/TWL 2XL LVL3 (GOWN DISPOSABLE) ×2 IMPLANT
GOWN STRL REUS W/TWL LRG LVL3 (GOWN DISPOSABLE) ×2 IMPLANT
NEEDLE HYPO 25X5/8 SAFETYGLIDE (NEEDLE) IMPLANT
NS IRRIG 1000ML POUR BTL (IV SOLUTION) ×2 IMPLANT
PACK C SECTION WH (CUSTOM PROCEDURE TRAY) ×2 IMPLANT
PAD OB MATERNITY 4.3X12.25 (PERSONAL CARE ITEMS) ×2 IMPLANT
RETRACTOR WND ALEXIS 25 LRG (MISCELLANEOUS) IMPLANT
RTRCTR C-SECT PINK 25CM LRG (MISCELLANEOUS) IMPLANT
RTRCTR WOUND ALEXIS 25CM LRG (MISCELLANEOUS)
STRIP CLOSURE SKIN 1/2X4 (GAUZE/BANDAGES/DRESSINGS) ×2 IMPLANT
SUT MON AB-0 CT1 36 (SUTURE) ×4 IMPLANT
SUT VIC AB 0 CT1 27 (SUTURE) ×1
SUT VIC AB 0 CT1 27XBRD ANBCTR (SUTURE) ×1 IMPLANT
SUT VIC AB 2-0 CT1 27 (SUTURE) ×2
SUT VIC AB 2-0 CT1 TAPERPNT 27 (SUTURE) ×2 IMPLANT
SUT VIC AB 4-0 KS 27 (SUTURE) ×2 IMPLANT
SYR BULB IRRIGATION 50ML (SYRINGE) ×2 IMPLANT
TOWEL OR 17X24 6PK STRL BLUE (TOWEL DISPOSABLE) ×2 IMPLANT
TRAY FOLEY CATH 14FR (SET/KITS/TRAYS/PACK) ×2 IMPLANT
WATER STERILE IRR 1000ML POUR (IV SOLUTION) ×2 IMPLANT

## 2013-07-16 NOTE — Progress Notes (Signed)
Marikay AlarDiana Saleeby is a 23 y.o. G1P0 at 6421w4d by ultrasound admitted for induction of labor due to rupture of membranes  Subjective: Pt has not progressed in almost 6 hours, has been examined by me x 2  And IUPC has confirmed adequate labor. Pt in full agreement with proceeding to Cesarean section.  Technical aspects of cesarean reviewed , such as risk of bleeding, infection, or injury to other organs , such as bladder. Pt desirous of proceeding without further labor.  Objective: BP 117/94  Pulse 99  Temp(Src) 100.1 F (37.8 C) (Axillary)  Resp 20  Ht 5\' 2"  (1.575 m)  Wt 69.854 kg (154 lb)  BMI 28.16 kg/m2  SpO2 100%   Total I/O In: -  Out: 1550 [Urine:1550]  FHT:  FHR: 180 bpm, variability: minimal ,  accelerations:  Present,  decelerations:  Absent UC:   irregular, every 3-5 minutes SVE:   Dilation: 7 Effacement (%): 80 Station: -2 Exam by:: Emelda FearFerguson, MD  Labs: Lab Results  Component Value Date   WBC 15.8* 07/15/2013   HGB 10.0* 07/15/2013   HCT 30.3* 07/15/2013   MCV 85.1 07/15/2013   PLT 291 07/15/2013    Assessment / Plan: Arrest in active phase of labor  Labor: pitocin discontinued Preeclampsia:   Fetal Wellbeing:  Category II Pain Control:  Epidural I/D:  n/a Anticipated MOD:  Cesarean section planned, arrangements with OR completed. Call team in process.  Ellwyn Ergle V 07/16/2013, 12:54 PM

## 2013-07-16 NOTE — Progress Notes (Signed)
Called to bedside by RN for prolonged decel.   Patient given terbutaline, oxygen, and amnioinfusion underway. She has some rebound fetal tachycardia after decels resolved but no abd tenderness or fever. Currently decels resolved with baseline around 140 and moderate variability. Baby appears to be tolerating now q 5 min contractions without decels.   Will re-start pitocin in 3 hours considering heart tracings remain reassuring.   Murtis SinkSam Bradshaw, MD [facility] Resident, PGY-2 07/16/2013, 1:36 AM  I have seen and examined this patient and I agree with the above. Cam HaiSHAW, Paula Zietz 1:47 AM 07/16/2013

## 2013-07-16 NOTE — Anesthesia Postprocedure Evaluation (Signed)
  Anesthesia Post-op Note  Patient: Ariel Edwards  Procedure(s) Performed: Procedure(s): CESAREAN SECTION (N/A)  Patient Location: PACU  Anesthesia Type:Epidural  Level of Consciousness: awake, alert  and oriented  Airway and Oxygen Therapy: Patient Spontanous Breathing  Post-op Pain: mild  Post-op Assessment: Post-op Vital signs reviewed, Patient's Cardiovascular Status Stable, Respiratory Function Stable, Patent Airway, No signs of Nausea or vomiting, Pain level controlled, No headache and No backache  Post-op Vital Signs: Reviewed and stable  Complications: No apparent anesthesia complications

## 2013-07-16 NOTE — Progress Notes (Signed)
Steward DroneBrenda, CRNA at bedside to redose patient at this time

## 2013-07-16 NOTE — Op Note (Signed)
07/14/2013 - 07/16/2013  3:19 PM  PATIENT:  Ariel Edwards  23 y.o. female  PRE-OPERATIVE DIAGNOSIS:  cephalopelvic disproportion  Chorioamnionitis Prolonged Rupture of membranes x 48 hours  POST-OPERATIVE DIAGNOSIS:  cephalopelvic disproportion chorioamnionitisProlonged Rupture of membranes x 48 hours    PROCEDURE:  Procedure(s): CESAREAN SECTION (N/A)  SURGEON:  Surgeon(s) and Role:    * Tilda BurrowJohn V Jenette Rayson, MD - Primary  PHYSICIAN ASSISTANT:  Details of procedure Patient was taken operating room, epidural anesthesia topped. The patient required bolus doses due to persistent spotty areas of discomfort. Lidocaine was injected along the incision site, and then the incision opened in standard Pfannenstiel technique. Once peritoneum was opened 30 cc and Nesacaine was injected into the peritoneal cavity and allowed a few minutes to take effect, the bladder flap developed, transverse uterine incision performed and the fetal vertex rotated into the incision. There was a thin nuchal cord x1 across the shoulders which may have contributed to the irregular heart rate tissues.  Infant was delivered easily past the pediatricians to their notes for details regarding fetal status Placenta was delivered easily the uterus irrigated with saline solution, closed in a running locking first layer was 0 Monocryl, followed by continuous running second layer. Flap was loosely reapproximated with running 2-0 Vicryl. Again care was irrigated, closed with running 2-0 Vicryl, fascia closed with running 0 Vicryl, the subcutaneous tissues were approximated with interrupted 2-0 Vicryl then 4-0 Vicryl used to close the skin. Sponge and needle counts were correct patient to recovery room It is felt the patient would be a very good candidate for vaginal birth after cesarean  if desired

## 2013-07-16 NOTE — Transfer of Care (Signed)
Immediate Anesthesia Transfer of Care Note  Patient: Ariel Edwards  Procedure(s) Performed: Procedure(s): CESAREAN SECTION (N/A)  Patient Location: PACU  Anesthesia Type:Epidural  Level of Consciousness: awake and alert   Airway & Oxygen Therapy: Patient Spontanous Breathing  Post-op Assessment: Report given to PACU RN and Post -op Vital signs reviewed and stable  Post vital signs: Reviewed and stable  Complications: No apparent anesthesia complications

## 2013-07-16 NOTE — Progress Notes (Signed)
Patient ID: Marikay AlarDiana Muralles, female   DOB: 09/15/1990, 23 y.o.   MRN: [id number] Marikay AlarDiana Billy is a 23 y.o. G1P0 at 8681w4d  admitted for PROM  Subjective: Now comfortable w/ epidural, recently had to be re-dosed by anesthesia  Objective: BP 110/67  Pulse 95  Temp(Src) 99.4 F (37.4 C) (Axillary)  Resp 20  Ht 5\' 2"  (1.575 m)  Wt 69.854 kg (154 lb)  BMI 28.16 kg/m2  SpO2 100% Total I/O In: -  Out: 1250 [Urine:1250]  FHT:  FHR: 180 bpm, variability: moderate,  accelerations:  Abscent,  decelerations:  Present occ variables and prolonged UC:   irregular, every 2-8 minutes MVUs: 130  SVE:  7-/80/0, vtx w/ some caput  Labs: Lab Results  Component Value Date   WBC 15.8* 07/15/2013   HGB 10.0* 07/15/2013   HCT 30.3* 07/15/2013   MCV 85.1 07/15/2013   PLT 291 07/15/2013    Assessment / Plan: IOL d/t prom now 48hrs ago, has developed temp 99.6ax, fetal tachycardia- and has been started on amp and gent and given 1gm apap po for presumed chorioamnionitis. Pitocin was d/c'd during middle of night d/t decels, and pt progressed on her own w/o pitocin. She has not progressed since 0500. Occ decels, but good variability. Strip reviewed w/ JVF, to start pitocin to try to acheive adequate uc's/dilation.   Labor: protracted active phase Fetal Wellbeing:  Category II Pain Control:  Epidural Pre-eclampsia: n/a I/D:  Amp & gent for chorioamnionitis Anticipated MOD:  NSVD  Marge DuncansBooker, Kimberly Randall CNM, WHNP-BC 07/16/2013, 11:21 AM

## 2013-07-16 NOTE — Progress Notes (Signed)
ANTIBIOTIC CONSULT NOTE - INITIAL  Pharmacy Consult for Gentamicin Indication: Chorioamnionitis   No Known Allergies  Patient Measurements: Height: 5\' 2"  (157.5 cm) Weight: 154 lb (69.854 kg) IBW/kg (Calculated) : 50.1 Adjusted Body Weight: 56.1  Vital Signs: Temp: 99.3 F (37.4 C) (02/28 1031) Temp src: Axillary (02/28 1031) BP: 112/62 mmHg (02/28 1031) Pulse Rate: 81 (02/28 1041)  Labs:  Recent Labs  07/15/13 0220  WBC 15.8*  HGB 10.0*  PLT 291   No results found for this basename: GENTTROUGH, GENTPEAK, GENTRANDOM,  in the last 72 hours   Microbiology: Recent Results (from the past 720 hour(s))  OB RESULTS CONSOLE GBS     Status: None   Collection Time    06/20/13 12:00 AM      Result Value Ref Range Status   GBS Negative   Final  GC/CHLAMYDIA PROBE AMP     Status: None   Collection Time    06/20/13  3:05 PM      Result Value Ref Range Status   CT Probe RNA NEGATIVE   Final   GC Probe RNA NEGATIVE   Final   Comment:                                                                                             **Normal Reference Range: Negative**                 Assay performed using the Gen-Probe APTIMA COMBO2 (R) Assay.           Acceptable specimen types for this assay include APTIMA Swabs (Unisex,     endocervical, urethral, or vaginal), first void urine, and ThinPrep     liquid based cytology samples.  CULTURE, BETA STREP (GROUP B ONLY)     Status: None   Collection Time    06/20/13  3:06 PM      Result Value Ref Range Status   Organism ID, Bacteria NO GROUP B STREP (S.AGALACTIAE) ISOLATED   Final    Medications:  Ampicillin 2 grams Q 6 hours  Assessment: 23 y.o. female G1P0 at 4461w4d with presumed chorioamnionitis. Temp 99.3 F Estimated Ke = 0.302, Vd = 0.39  Goal of Therapy:  Gentamicin peak 6-8 mg/L and Trough < 1 mg/L  Plan:  Gentamicin 160 mg IV x 1  Gentamicin 150 mg IV every 8 hrs  Check Scr with next labs if gentamicin continued. Will  check gentamicin levels if continued > 72hr or clinically indicated.  Ariel Edwards 07/16/2013,10:54 AM

## 2013-07-16 NOTE — Progress Notes (Signed)
Ariel AlarDiana Edwards is a 23 y.o. G1P0 at 1674w3d   Subjective: Pain improved with hands and knees position.    Objective: BP 109/59  Pulse 102  Temp(Src) 98.4 F (36.9 C) (Oral)  Resp 20  Ht 5\' 2"  (1.575 m)  Wt 69.854 kg (154 lb)  BMI 28.16 kg/m2  SpO2 99%     Abd: No abd tenderness  FHT:  FHR: 150 bpm, variability: moderate,  accelerations:  Present,  decelerations:  Present lates and variables UC:   irregular, every  2-5 minutes  SVE:   Dilation: 9 Effacement (%): 90 Station: 0 Exam by:: Ariel MemosBradshaw, MD-  Labs: Lab Results  Component Value Date   WBC 15.8* 07/15/2013   HGB 10.0* 07/15/2013   HCT 30.3* 07/15/2013   MCV 85.1 07/15/2013   PLT 291 07/15/2013    Assessment / Plan: Active labor  Prolonged ROM x 40+hrs- afebrile  Labor: progressing normally, pitocin stopped due to decels 3+ hours ago, Continue amnioinfusion, Some cervical change with current contraction pattern so will continue without pitocin FWB: Category 2 strip, continued decels but still good variability Pain: epidural ID: GBS negative, no signs of chorioamneitis MOD: anticipate NSVD   Ariel FentonBradshaw, Ariel Edwards 07/16/2013, 8:24 AM  I have seen and examined this patient and I agree with the above. Ariel Edwards, Ariel Edwards 8:51 AM 07/16/2013

## 2013-07-16 NOTE — Progress Notes (Signed)
Ariel Edwards is a 23 y.o. G1P0 at 3166w3d   Subjective: Feeling increased pressure and continued back pain.   Objective: BP 114/45  Pulse 97  Temp(Src) 98 F (36.7 C) (Oral)  Resp 16  Ht 5\' 2"  (1.575 m)  Wt 69.854 kg (154 lb)  BMI 28.16 kg/m2  SpO2 99%     Abd: No abd tenderness  FHT:  FHR: 145 bpm, variability: moderate,  accelerations:  Present,  decelerations:  Present lates and variables UC:   irregular, every  3-5 minutes  SVE:   Dilation: 8 Effacement (%): 80 Station: -1 Exam by:: Dr. Leta BaptistBradshaw/Klashley, RN-  Labs: Lab Results  Component Value Date   WBC 15.8* 07/15/2013   HGB 10.0* 07/15/2013   HCT 30.3* 07/15/2013   MCV 85.1 07/15/2013   PLT 291 07/15/2013    Assessment / Plan: Active labor Prolonged ROM x 40+hrs- afebrile  Labor: progressing normally, pitocin stopped due to decels 3+ hours ago, Continue amnioinfusion, Some cervical change with current contraction pattern, I think these are all the baby will tolerate so will continue to hold pitocin.  FWB: Category 2 strip, continued decels but still good variability Pain: epidural ID: GBS negative, no signs of chorioamneitis MOD: anticipate NSVD   Kevin FentonBradshaw, Samuel 07/16/2013, 5:16 AM  I have seen and examined this patient and I agree with the above. Cam HaiSHAW, Faithanne Verret 8:52 AM 07/16/2013

## 2013-07-16 NOTE — Brief Op Note (Signed)
07/14/2013 - 07/16/2013  3:19 PM  PATIENT:  Ariel Edwards  23 y.o. female  PRE-OPERATIVE DIAGNOSIS:  cephalopelvic disproportion  Chorioamnionitis Prolonged Rupture of membranes x 48 hours  POST-OPERATIVE DIAGNOSIS:  cephalopelvic disproportion chorioamnionitisProlonged Rupture of membranes x 48 hours    PROCEDURE:  Procedure(s): CESAREAN SECTION (N/A)  SURGEON:  Surgeon(s) and Role:    * Tilda BurrowJohn V Hanson Medeiros, MD - Primary  PHYSICIAN ASSISTANT:   ASSISTANTS: none   ANESTHESIA:   local and epidural  EBL:  Total I/O In: 2200 [I.V.:2200] Out: 2200 [Urine:1700; Blood:500]  BLOOD ADMINISTERED:none  DRAINS: Urinary Catheter (Foley)   LOCAL MEDICATIONS USED:  LIDOCAINE , Amount:20 cc ml and OTHER Nesacaine 30 cc  SPECIMEN:  Source of Specimen:  placenta, to L& D  DISPOSITION OF SPECIMEN:  PATHOLOGY  COUNTS:  YES  TOURNIQUET:  * No tourniquets in log *  DICTATION: .Dragon Dictation  PLAN OF CARE: has inpt order  PATIENT DISPOSITION:  PACU - hemodynamically stable.   Delay start of Pharmacological VTE agent (>24hrs) due to surgical blood loss or risk of bleeding: not applicable

## 2013-07-16 NOTE — Progress Notes (Signed)
FSE became detatched; external cardio reapplied

## 2013-07-17 LAB — CBC
HCT: 25.7 % — ABNORMAL LOW (ref 36.0–46.0)
Hemoglobin: 8.3 g/dL — ABNORMAL LOW (ref 12.0–15.0)
MCH: 27.8 pg (ref 26.0–34.0)
MCHC: 32.3 g/dL (ref 30.0–36.0)
MCV: 86 fL (ref 78.0–100.0)
Platelets: 230 10*3/uL (ref 150–400)
RBC: 2.99 MIL/uL — ABNORMAL LOW (ref 3.87–5.11)
RDW: 14.9 % (ref 11.5–15.5)
WBC: 21.2 10*3/uL — AB (ref 4.0–10.5)

## 2013-07-17 NOTE — Progress Notes (Signed)
Clinical Social Work Department PSYCHOSOCIAL ASSESSMENT - MATERNAL/CHILD 07/17/2013  Patient:  Ariel Edwards,Ariel Edwards  Account Number:  [id number]  Admit Date:  07/14/2013  Childs Name:   [name]    Clinical Social Worker:  Torrin Crihfield, LCSW   Date/Time:  07/17/2013 04:00 PM  Date Referred:  07/17/2013   Referral source  CSW     Referred reason  Substance Abuse   Other referral source:    I:  FAMILY / HOME ENVIRONMENT Child's legal guardian:  PARENT  Guardian - Name Guardian - Age Guardian - Address  Anthis, Laquia [address]  [place], [place] [address]  Williamson, Brent  same as above   Other household support members/support persons Other support:    II  PSYCHOSOCIAL DATA Information Source:  Patient Interview  Financial and Community Resources Employment:   Mother is employed   Financial resources:  Medicaid If Medicaid - County:   Other  WIC  Food Stamps   School / Grade:   Maternity Care Coordinator / Child Services Coordination / Early Interventions:  Cultural issues impacting care:    III  STRENGTHS Strengths  Understanding of illness  Supportive family/friends  Home prepared for Child (including basic supplies)  Adequate Resources   Strength comment:    IV  RISK FACTORS AND CURRENT PROBLEMS Current Problem:     Risk Factor & Current Problem Patient Issue Family Issue Risk Factor / Current Problem Comment  Substance Abuse Y N hx of marijuana use    V  SOCIAL WORK ASSESSMENT Acknowledged Social Work consult to assess mother's history of marijuana use.  Mother was receptive to social work intervention.   She is a single parent with no other dependents.   FOB and maternal grandmother were present and mother gave permission to speak in front of them.  She and FOB cohabitate.      Mother notes hx of marijuana prior to pregnancy.  She denies any use during pregnancy and stated that she also quit smoking cigarettes during pregnancy. She denies any hx of  mental illness.  She was informed of the hospital's newborn drug screen policy.   UDS on newborn pending.   She seemed confident that the test would be negative.    Mother informed of social work availability.      VI SOCIAL WORK PLAN Social Work Plan  No Barriers to Discharge   Type of pt/family education:   If child protective services report - county:   If child protective services report - date:   Information/referral to community resources comment:   Other social work plan:   CSW will monitor drug screens.     

## 2013-07-17 NOTE — Progress Notes (Signed)
Subjective: Postpartum Day 1: Cesarean Delivery Hasn't yet eaten solid foods, states she's still on clear liquid diet. Drinking, ambulating well. Lochia and pain wnl.  Denies dizziness, lightheadedness, or sob. No complaints.  Foley still in.   Objective: Vital signs in last 24 hours: Temp:  [97.4 F (36.3 C)-100.1 F (37.8 C)] 97.4 F (36.3 C) (03/01 0346) Pulse Rate:  [64-144] 73 (03/01 0346) Resp:  [12-22] 18 (03/01 0346) BP: (77-159)/(25-94) 93/55 mmHg (03/01 0346) SpO2:  [90 %-100 %] 93 % (03/01 0346)  Physical Exam:  General: alert, cooperative and no distress Lochia: appropriate Uterine Fundus: firm Incision: healing well, no significant drainage, no dehiscence, no significant erythema DVT Evaluation: No evidence of DVT seen on physical exam. Negative Homan's sign. No cords or calf tenderness. No significant calf/ankle edema.   Recent Labs  07/15/13 0220 07/17/13 0556  HGB 10.0* 8.3*  HCT 30.3* 25.7*    Assessment/Plan: Status post Cesarean section. Doing well postoperatively.  Remove foley, eat breakfast, change iv to nsl. Breastfeeding Plans mirena for contraception  Marge DuncansBooker, Celenia Hruska Randall 07/17/2013, 7:21 AM

## 2013-07-17 NOTE — Anesthesia Postprocedure Evaluation (Signed)
Anesthesia Post Note  Patient: Ariel Edwards  Procedure(s) Performed: Procedure(s) (LRB): CESAREAN SECTION (N/A)  Anesthesia type: Epidural  Patient location: Mother/Baby  Post pain: Pain level controlled  Post assessment: Post-op Vital signs reviewed  Last Vitals:  Filed Vitals:   07/17/13 0346  BP: 93/55  Pulse: 73  Temp: 36.3 C  Resp: 18    Post vital signs: Reviewed  Level of consciousness:alert  Complications: No apparent anesthesia complications

## 2013-07-17 NOTE — Addendum Note (Signed)
Addendum created 07/17/13 0756 by Algis GreenhouseLinda A Mack Thurmon, CRNA   Modules edited: Notes Section   Notes Section:  File: [id number]

## 2013-07-18 ENCOUNTER — Encounter: Payer: Medicaid Other | Admitting: Obstetrics & Gynecology

## 2013-07-18 ENCOUNTER — Encounter (HOSPITAL_COMMUNITY): Payer: Self-pay | Admitting: Obstetrics and Gynecology

## 2013-07-18 MED ORDER — IBUPROFEN 600 MG PO TABS: 600.0000 mg | ORAL_TABLET | Freq: Four times a day (QID) | ORAL | Status: DC

## 2013-07-18 MED ORDER — OXYCODONE-ACETAMINOPHEN 5-325 MG PO TABS: 1.0000 | ORAL_TABLET | ORAL | Status: DC | PRN

## 2013-07-18 NOTE — Progress Notes (Signed)
UR chart review completed.  

## 2013-07-18 NOTE — Discharge Instructions (Signed)
Before [facility] Ask any questions about feeding, diapering, and baby care before you leave the hospital. Ask again if you do not understand. Ask when you need to see the doctor again. There are several things you must have before your baby comes home.  Infant car seat.  Crib.  Do not let your baby sleep in a bed with you or anyone else.  If you do not have a bed for your baby, ask the doctor what you can use that will be safe for the baby to sleep in. Infant feeding supplies:  6 to 8 bottles (8 oz. size).  6 to 8 nipples.  Measuring cup.  Measuring tablespoon.  Bottle brush.  Sterilizer (or use any large pan or kettle with a lid).  Formula that contains iron.  A way to boil and cool water. Breastfeeding supplies:  Breast pump.  Nipple cream. Clothing:  24 to 36 cloth diapers and waterproof diaper covers or a box of disposable diapers. You may need as many as 10 to 12 diapers per day.  3 onesies (other clothing will depend on the time of year and the weather).  3 receiving blankets.  3 baby pajamas or gowns.  3 bibs. Bath equipment:  Mild soap.  Petroleum jelly. No baby oil or powder.  Soft cloth towel and wash cloth.  Cotton balls.  Separate bath basin for baby. Only sponge bathe until umbilical cord and circumcision are healed. Other supplies:  Thermometer and bulb syringe (ask the hospital to send them home with you). Ask your doctor about how you should take your baby's temperature.  One to two pacifiers. Prepare for an emergency:  Know how to get to the hospital and know where to admit your baby.  Put all doctor numbers near your house phone and in your cell phone if you have one. Prepare your family:  Talk with siblings about the baby coming home and how they feel about it.  Decide how you want to handle visitors and other family members.  Take offers for help with the baby. You will need time to adjust. Know when to call the doctor.   GET HELP RIGHT AWAY IF:  Your baby's temperature is greater than 100.4 F (38 C).  The softspot on your baby's head starts to bulge.  Your baby is crying with no tears or has no wet diapers for 6 hours.  Your baby has rapid breathing.  Your baby is not as alert. Document Released: 04/17/2008 Document Revised: 07/28/2011 Document Reviewed: 07/25/2010 [facility] Patient Information 2014 [place], [place]. Cesarean Delivery Care After Refer to this sheet in the next few weeks. These instructions provide you with information on caring for yourself after your procedure. Your health care provider may also give you specific instructions. Your treatment has been planned according to current medical practices, but problems sometimes occur. Call your health care provider if you have any problems or questions after you go home. HOME CARE INSTRUCTIONS  Only take over-the-counter or prescription medications as directed by your health care provider.  Do not drink alcohol, especially if you are breastfeeding or taking medication to relieve pain.  Do not chew or smoke tobacco.  Continue to use good perineal care. Good perineal care includes:  Wiping your perineum from front to back.  Keeping your perineum clean.  Check your surgical cut (incision) daily for increased redness, drainage, swelling, or separation of skin.  Clean your incision gently with soap and water every day, and then pat  it dry. If your health care provider says it is OK, leave the incision uncovered. Use a bandage (dressing) if the incision is draining fluid or appears irritated. If the adhesive strips across the incision do not fall off within 7 days, carefully peel them off.  Hug a pillow when coughing or sneezing until your incision is healed. This helps to relieve pain.  Do not use tampons or douche until your health care provider says it is okay.  Shower, wash your hair, and take tub baths as directed by your health care  provider.  Wear a well-fitting bra that provides breast support.  Limit wearing support panties or control-top hose.  Drink enough fluids to keep your urine clear or pale yellow.  Eat high-fiber foods such as whole grain cereals and breads, brown rice, beans, and fresh fruits and vegetables every day. These foods may help prevent or relieve constipation.  Resume activities such as climbing stairs, driving, lifting, exercising, or traveling as directed by your health care provider.  Talk to your health care provider about resuming sexual activities. This is dependent upon your risk of infection, your rate of healing, and your comfort and desire to resume sexual activity.  Try to have someone help you with your household activities and your newborn for at least a few days after you leave the hospital.  Rest as much as possible. Try to rest or take a nap when your newborn is sleeping.  Increase your activities gradually.  Keep all of your scheduled postpartum appointments. It is very important to keep your scheduled follow-up appointments. At these appointments, your health care provider will be checking to make sure that you are healing physically and emotionally. SEEK MEDICAL CARE IF:   You are passing large clots from your vagina. Save any clots to show your health care provider.  You have a foul smelling discharge from your vagina.  You have trouble urinating.  You are urinating frequently.  You have pain when you urinate.  You have a change in your bowel movements.  You have increasing redness, pain, or swelling near your incision.  You have pus draining from your incision.  Your incision is separating.  You have painful, hard, or reddened breasts.  You have a severe headache.  You have blurred vision or see spots.  You feel sad or depressed.  You have thoughts of hurting yourself or your newborn.  You have questions about your care, the care of your newborn, or  medications.  You are dizzy or lightheaded.  You have a rash.  You have pain, redness, or swelling at the site of the removed intravenous access (IV) tube.  You have nausea or vomiting.  You stopped breastfeeding and have not had a menstrual period within 12 weeks of stopping.  You are not breastfeeding and have not had a menstrual period within 12 weeks of delivery.  You have a fever. SEEK IMMEDIATE MEDICAL CARE IF:  You have persistent pain.  You have chest pain.  You have shortness of breath.  You faint.  You have leg pain.  You have stomach pain.  Your vaginal bleeding saturates 2 or more sanitary pads in 1 hour. MAKE SURE YOU:   Understand these instructions.  Will watch your condition.  Will get help right away if you are not doing well or get worse. Document Released: 01/25/2002 Document Revised: 01/05/2013 Document Reviewed: 12/31/2011 [facility] Patient Information 2014 [place].

## 2013-07-18 NOTE — Discharge Summary (Signed)
Obstetric Discharge Summary Reason for Admission: PROM Prenatal Procedures: NST and ultrasound Intrapartum Procedures: cesarean: low cervical, transverse Postpartum Procedures: none Complications-Operative and Postpartum: none Hemoglobin  Date Value Ref Range Status  07/17/2013 8.3* 12.0 - 15.0 g/dL Final  1/3/08657/11/2012 [id number]   Final     HCT  Date Value Ref Range Status  07/17/2013 25.7* 36.0 - 46.0 % Final  11/22/2012 40   Final   Hospital Course: Mrs. Ariel Edwards is a 23 yo G1P1001 that came in for PROM at 6640w4d. Her pregnancy was complicated by chorioamnionitis, cephalopelvic disproportion, and PROM x 48 hrs. She delivered a viable baby girl via PLTCS at 14:26 on 2/28. Infant had Apgars of 8 and 9 and weighed 6 lb 2.1 oz. Postpartum course has been uncomplicated.  The patient is breastfeeding without difficulty and wants Mirena for contraception.  Physical Exam:  General: alert, cooperative and no distress Lochia: appropriate Uterine Fundus: firm Incision: healing well, no significant drainage DVT Evaluation: No evidence of DVT seen on physical exam.  Discharge Diagnoses: Term Pregnancy-delivered  Discharge Information: Date: 07/18/2013 Activity: unrestricted Diet: routine Medications: PNV and Ibuprofen Condition: stable Instructions: refer to practice specific booklet Discharge to: home Follow-up Information   [facility]   Contact information:   [address] [place] [id number] [phone]      Newborn Data: Live born female  Birth Weight: 6 lb 2.1 oz (2780 g) APGAR: 8, 9  Home with mother.  Ariel Edwards, Ariel Edwards 07/18/2013, 8:06 AM  I examined pt and agree with documentation above and nurse midwife student plan of care. [facility],Ariel Edwards

## 2013-07-20 NOTE — Discharge Summary (Signed)
Attestation of Attending Supervision of Advanced Practitioner (CNM/NP): Evaluation and management procedures were performed by the Advanced Practitioner under my supervision and collaboration.  I have reviewed the Advanced Practitioner's note and chart, and I agree with the management and plan.  HARRAWAY-SMITH, Jatavius Ellenwood 5:10 PM     

## 2013-07-26 ENCOUNTER — Ambulatory Visit (HOSPITAL_COMMUNITY)
Admit: 2013-07-26 | Discharge: 2013-07-26 | Disposition: A | Discharge status: Home / Self Care | Payer: Medicaid Other | Attending: Obstetrics & Gynecology | Admitting: Obstetrics & Gynecology

## 2013-08-02 ENCOUNTER — Ambulatory Visit (INDEPENDENT_AMBULATORY_CARE_PROVIDER_SITE_OTHER): Payer: Medicaid Other | Admitting: Obstetrics & Gynecology

## 2013-08-02 ENCOUNTER — Encounter: Payer: Self-pay | Admitting: Obstetrics & Gynecology

## 2013-08-02 VITALS — BP 107/67 | HR 78 | Resp 16 | Ht 62.0 in | Wt 126.0 lb

## 2013-08-02 DIAGNOSIS — G8918 Other acute postprocedural pain: Secondary | ICD-10-CM

## 2013-08-02 DIAGNOSIS — R109 Unspecified abdominal pain: Secondary | ICD-10-CM

## 2013-08-02 MED ORDER — TRAMADOL-ACETAMINOPHEN 37.5-325 MG PO TABS: 1.0000 | ORAL_TABLET | Freq: Four times a day (QID) | ORAL | Status: AC | PRN

## 2013-08-02 NOTE — Progress Notes (Signed)
Patient ID: Ariel Edwards Isensee, female   DOB: 03/13/1991, 23 y.o.   MRN: [id number] Subjective:     Ariel Edwards Lax is a 23 y.o. female who presents to the clinic 2 weeks status post cesarean section for  . Eating a regular diet without difficulty. Bowel movements are normal. Pain is not well controlled.  Medications being used: ibuprofen (OTC).  The following portions of the patient's history were reviewed and updated as appropriate: allergies, current medications, past family history, past medical history, past social history, past surgical history and problem list.  Review of Systems Pertinent items are noted in HPI.    Objective:    BP 107/67  Pulse 78  Resp 16  Ht 5\' 2"  (1.575 m)  Wt 126 lb (57.153 kg)  BMI 23.04 kg/m2  Breastfeeding? Yes General:  alert and cooperative  Abdomen: soft, bowel sounds active, non-tender  Incision:   healing well, no drainage, no erythema, no hernia, no seroma, no swelling, no dehiscence, incision well approximated     Assessment:    Postoperative course complicated by continued moderate pain Operative findings again reviewed. Pathology report discussed.    Plan:    1. Continue any current medications. 2. Wound care discussed. 3. Activity restrictions: no lifting more than 15 pounds 4. Anticipated return to work: 4 weeks. 5. Follow up: 4 weeks   Ultracet for pain rx  Adam PhenixJames G Helma Argyle, MD 08/02/2013

## 2013-08-02 NOTE — Patient Instructions (Signed)
Cesarean Delivery °Care After °Refer to this sheet in the next few weeks. These instructions provide you with information on caring for yourself after your procedure. Your health care provider may also give you specific instructions. Your treatment has been planned according to current medical practices, but problems sometimes occur. Call your health care provider if you have any problems or questions after you go home. °HOME CARE INSTRUCTIONS  °· Only take over-the-counter or prescription medications as directed by your health care provider. °· Do not drink alcohol, especially if you are breastfeeding or taking medication to relieve pain. °· Do not chew or smoke tobacco. °· Continue to use good perineal care. Good perineal care includes: °· Wiping your perineum from front to back. °· Keeping your perineum clean. °· Check your surgical cut (incision) daily for increased redness, drainage, swelling, or separation of skin. °· Clean your incision gently with soap and water every day, and then pat it dry. If your health care provider says it is OK, leave the incision uncovered. Use a bandage (dressing) if the incision is draining fluid or appears irritated. If the adhesive strips across the incision do not fall off within 7 days, carefully peel them off. °· Hug a pillow when coughing or sneezing until your incision is healed. This helps to relieve pain. °· Do not use tampons or douche until your health care provider says it is okay. °· Shower, wash your hair, and take tub baths as directed by your health care provider. °· Wear a well-fitting bra that provides breast support. °· Limit wearing support panties or control-top hose. °· Drink enough fluids to keep your urine clear or pale yellow. °· Eat high-fiber foods such as whole grain cereals and breads, brown rice, beans, and fresh fruits and vegetables every day. These foods may help prevent or relieve constipation. °· Resume activities such as climbing stairs,  driving, lifting, exercising, or traveling as directed by your health care provider. °· Talk to your health care provider about resuming sexual activities. This is dependent upon your risk of infection, your rate of healing, and your comfort and desire to resume sexual activity. °· Try to have someone help you with your household activities and your newborn for at least a few days after you leave the hospital. °· Rest as much as possible. Try to rest or take a nap when your newborn is sleeping. °· Increase your activities gradually. °· Keep all of your scheduled postpartum appointments. It is very important to keep your scheduled follow-up appointments. At these appointments, your health care provider will be checking to make sure that you are healing physically and emotionally. °SEEK MEDICAL CARE IF:  °· You are passing large clots from your vagina. Save any clots to show your health care provider. °· You have a foul smelling discharge from your vagina. °· You have trouble urinating. °· You are urinating frequently. °· You have pain when you urinate. °· You have a change in your bowel movements. °· You have increasing redness, pain, or swelling near your incision. °· You have pus draining from your incision. °· Your incision is separating. °· You have painful, hard, or reddened breasts. °· You have a severe headache. °· You have blurred vision or see spots. °· You feel sad or depressed. °· You have thoughts of hurting yourself or your newborn. °· You have questions about your care, the care of your newborn, or medications. °· You are dizzy or lightheaded. °· You have a rash. °· You   have pain, redness, or swelling at the site of the removed intravenous access (IV) tube. °· You have nausea or vomiting. °· You stopped breastfeeding and have not had a menstrual period within 12 weeks of stopping. °· You are not breastfeeding and have not had a menstrual period within 12 weeks of delivery. °· You have a fever. °SEEK  IMMEDIATE MEDICAL CARE IF: °· You have persistent pain. °· You have chest pain. °· You have shortness of breath. °· You faint. °· You have leg pain. °· You have stomach pain. °· Your vaginal bleeding saturates 2 or more sanitary pads in 1 hour. °MAKE SURE YOU:  °· Understand these instructions. °· Will watch your condition. °· Will get help right away if you are not doing well or get worse. °Document Released: 01/25/2002 Document Revised: 01/05/2013 Document Reviewed: 12/31/2011 °ExitCare® Patient Information ©2014 [employer]. ° ° ° °

## 2013-08-18 ENCOUNTER — Encounter: Payer: Self-pay | Admitting: *Deleted

## 2013-08-22 ENCOUNTER — Encounter: Payer: Self-pay | Admitting: Obstetrics & Gynecology

## 2013-08-22 ENCOUNTER — Ambulatory Visit (INDEPENDENT_AMBULATORY_CARE_PROVIDER_SITE_OTHER): Payer: Self-pay | Admitting: Obstetrics & Gynecology

## 2013-08-22 DIAGNOSIS — Z3009 Encounter for other general counseling and advice on contraception: Secondary | ICD-10-CM

## 2013-08-22 MED ORDER — NORGESTIM-ETH ESTRAD TRIPHASIC 0.18/0.215/0.25 MG-35 MCG PO TABS: 1.0000 | ORAL_TABLET | Freq: Every day | ORAL | Status: AC

## 2013-08-22 NOTE — Patient Instructions (Signed)
Oral Contraception Information  Oral contraceptive pills (OCPs) are medicines taken to prevent pregnancy. OCPs work by preventing the ovaries from releasing eggs. The hormones in OCPs also cause the cervical mucus to thicken, preventing the sperm from entering the uterus. The hormones also cause the uterine lining to become thin, not allowing a fertilized egg to attach to the inside of the uterus. OCPs are highly effective when taken exactly as prescribed. However, OCPs do not prevent sexually transmitted diseases (STDs). Safe sex practices, such as using condoms along with the pill, can help prevent STDs.   Before taking the pill, you may have a physical exam and Pap test. Your health care provider may order blood tests. The health care provider will make sure you are a good candidate for oral contraception. Discuss with your health care provider the possible side effects of the OCP you may be prescribed. When starting an OCP, it can take 2 to 3 months for the body to adjust to the changes in hormone levels in your body.   TYPES OF ORAL CONTRACEPTION  · The combination pill This pill contains estrogen and progestin (synthetic progesterone) hormones. The combination pill comes in 21-day, 28-day, or 91-day packs. Some types of combination pills are meant to be taken continuously (365-day pills). With 21-day packs, you do not take pills for 7 days after the last pill. With 28-day packs, the pill is taken every day. The last 7 pills are without hormones. Certain types of pills have more than 21 hormone-containing pills. With 91-day packs, the first 84 pills contain both hormones, and the last 7 pills contain no hormones or contain estrogen only.  · The minipill This pill contains the progesterone hormone only. The pill is taken every day continuously. It is very important to take the pill at the same time each day. The minipill comes in packs of 28 pills. All 28 pills contain the hormone.    ADVANTAGES OF ORAL  CONTRACEPTIVE PILLS  · Decreases premenstrual symptoms.    · Treats menstrual period cramps.    · Regulates the menstrual cycle.    · Decreases a heavy menstrual flow.    · May treat acne, depending on the type of pill.    · Treats abnormal uterine bleeding.    · Treats polycystic ovarian syndrome.    · Treats endometriosis.    · Can be used as emergency contraception.    THINGS THAT CAN MAKE ORAL CONTRACEPTIVE PILLS LESS EFFECTIVE  OCPs can be less effective if:   · You forget to take the pill at the same time every day.    · You have a stomach or intestinal disease that lessens the absorption of the pill.    · You take OCPs with other medicines that make OCPs less effective, such as antibiotics, certain HIV medicines, and some seizure medicines.    · You take expired OCPs.    · You forget to restart the pill on day 7, when using the packs of 21 pills.    RISKS ASSOCIATED WITH ORAL CONTRACEPTIVE PILLS   Oral contraceptive pills can sometimes cause side effects, such as:  · Headache.  · Nausea.  · Breast tenderness.  · Irregular bleeding or spotting.  Combination pills are also associated with a small increased risk of:  · Blood clots.  · Heart attack.  · Stroke.  Document Released: 07/26/2002 Document Revised: 02/23/2013 Document Reviewed: 10/24/2012  ExitCare® Patient Information ©2014 [employer], [employer].

## 2013-08-22 NOTE — Progress Notes (Signed)
Patient ID: Ariel Edwards, female   DOB: 05/28/1990, 23 y.o.   MRN: [id number] Subjective:     Ariel Edwards is a 23 y.o. female who presents for a postpartum visit. She is 6 weeks postpartum following a low cervical transverse Cesarean section. I have fully reviewed the prenatal and intrapartum course. The delivery was at 40 gestational weeks. Outcome: primary cesarean section, low transverse incision. Anesthesia: epidural. Postpartum course has been normal. Baby's course has been good. Baby is feeding by bottle -  . Bleeding moderate lochia. Bowel function is normal. Bladder function is normal. Patient is not sexually active. Contraception method is OCP (estrogen/progesterone). Postpartum depression screening: negative.  The following portions of the patient's history were reviewed and updated as appropriate: allergies, current medications, past family history, past medical history, past social history, past surgical history and problem list.  Review of Systems Pertinent items are noted in HPI.   Objective:    BP 104/69  Pulse 77  Resp 16  Ht 5\' 2"  (1.575 m)  Wt 121 lb (54.885 kg)  BMI 22.13 kg/m2  Breastfeeding? Yes  General:     Breasts:    Lungs:   Heart:    Abdomen: soft, non-tender; bowel sounds normal; no masses,  no organomegaly and incision ok   Vulva:  not evaluated  Vagina: not evaluated  Cervix:     Corpus: not examined  Adnexa:  not evaluated  Rectal Exam: Not performed.        Assessment:     normal postpartum exam. Pap smear not done at today's visit.   Plan:    1. Contraception: OCP (estrogen/progesterone) 2. Trisprintec 3. Follow up in:1 year  Adam PhenixJames G Kaydense Rizo, MD

## 2013-08-25 ENCOUNTER — Telehealth: Payer: Self-pay | Admitting: *Deleted

## 2013-08-25 DIAGNOSIS — IMO0001 Reserved for inherently not codable concepts without codable children: Secondary | ICD-10-CM

## 2013-08-25 MED ORDER — NORETHINDRONE 0.35 MG PO TABS: 1.0000 | ORAL_TABLET | Freq: Every day | ORAL | Status: AC

## 2013-08-25 NOTE — Telephone Encounter (Signed)
[facility]  [facility]- states they [facility] limited hours , wanted to leave message for Dr. Debroah LoopArnold who is in our clinic today.  States Lafonda MossesDiana wants to switch to a progesterone only birth control pill like micronor  because she is breastfeeding- she has not started the other one yet.

## 2013-08-30 ENCOUNTER — Ambulatory Visit: Payer: Medicaid Other | Admitting: Obstetrics & Gynecology

## 2013-11-04 NOTE — Telephone Encounter (Signed)
done

## 2014-03-20 ENCOUNTER — Encounter: Payer: Self-pay | Admitting: Obstetrics & Gynecology

## 2015-09-07 ENCOUNTER — Other Ambulatory Visit (HOSPITAL_COMMUNITY): Payer: Self-pay | Admitting: Nurse Practitioner

## 2015-09-07 DIAGNOSIS — R102 Pelvic and perineal pain: Secondary | ICD-10-CM

## 2015-09-20 IMAGING — US US OB FOLLOW-UP
2 series · 12 of 28 positions shown · non-contrast
Comparison: none

[Series 1: us ob follow up · 9 of 40 slices shown (1 of 2)]
[im 2/40]
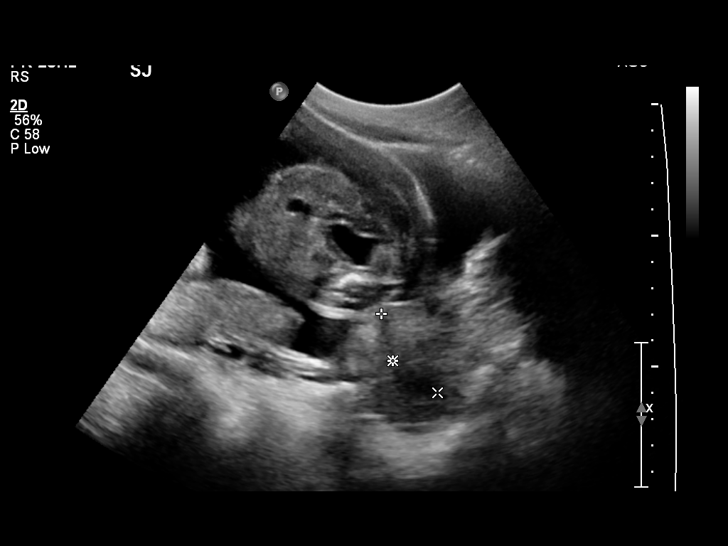
[im 6/40]
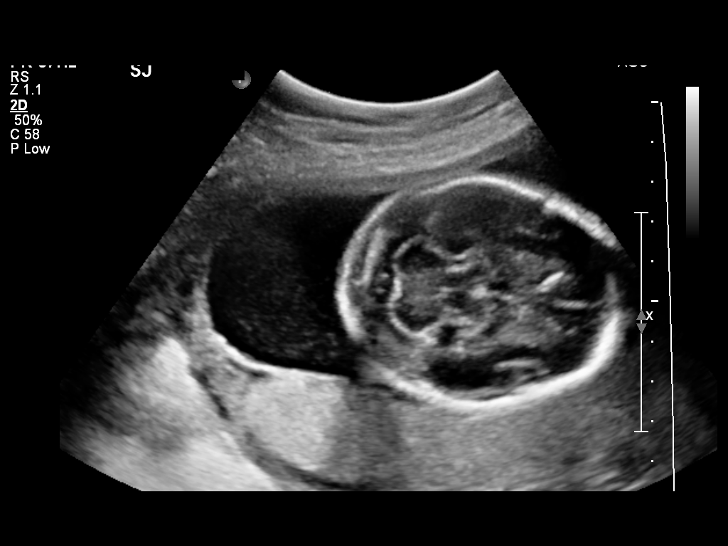
[im 10/40]
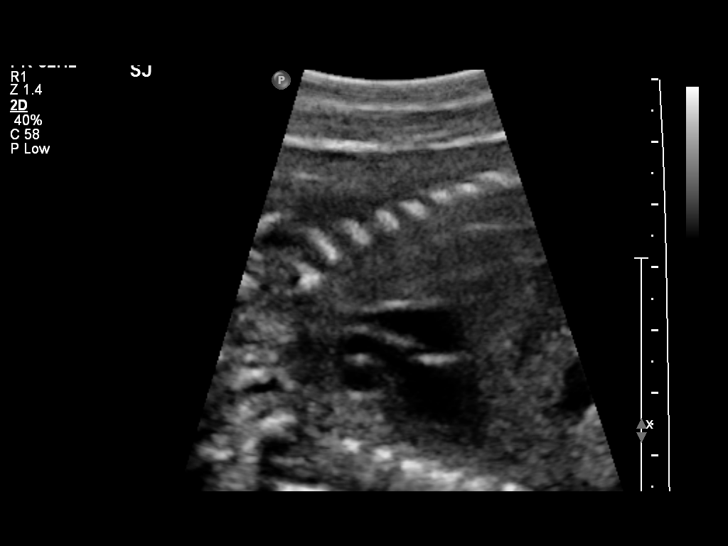
[im 15/40]
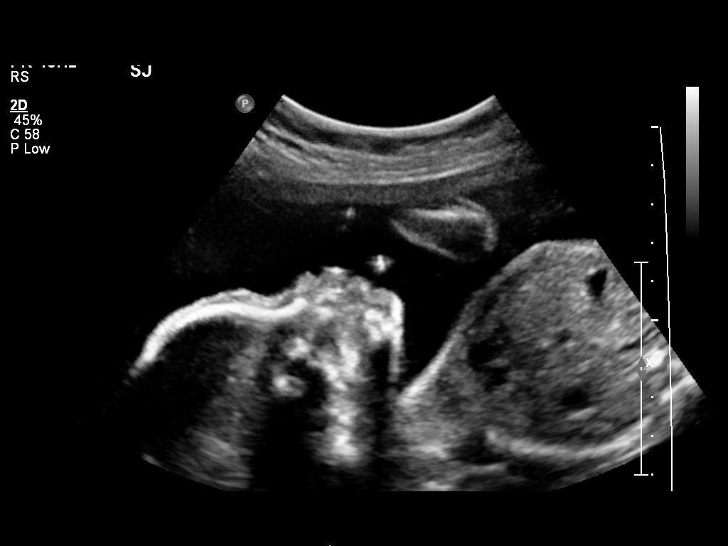
[im 19/40]
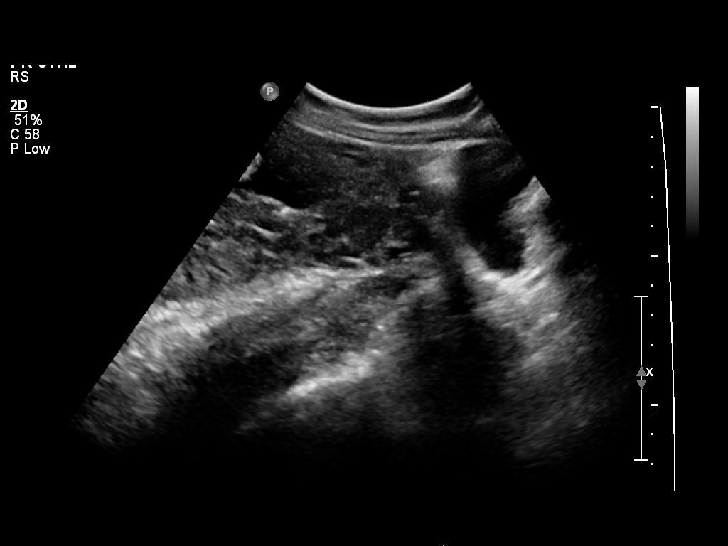
[im 23/40]
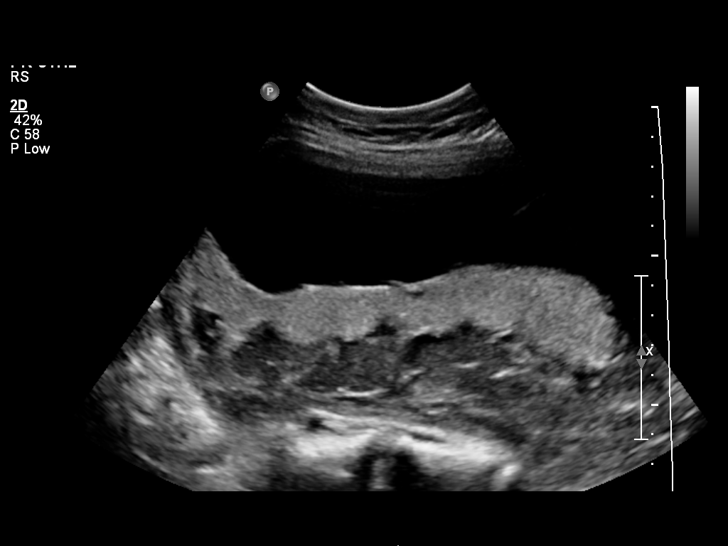
[im 28/40]
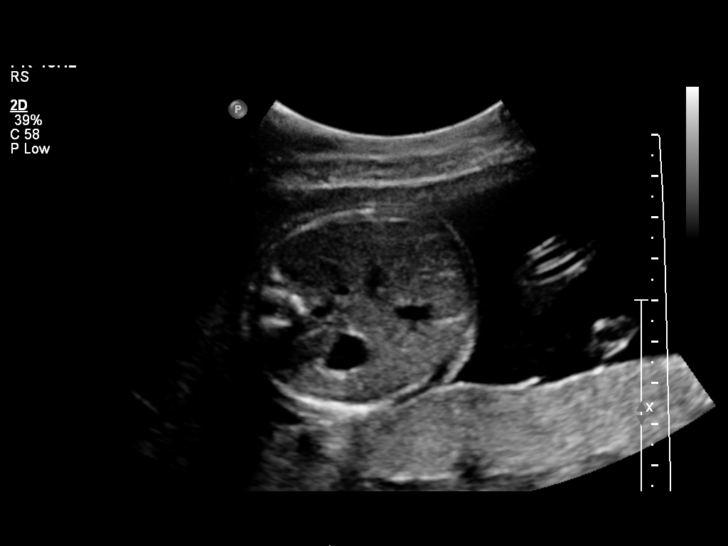
[im 32/40]
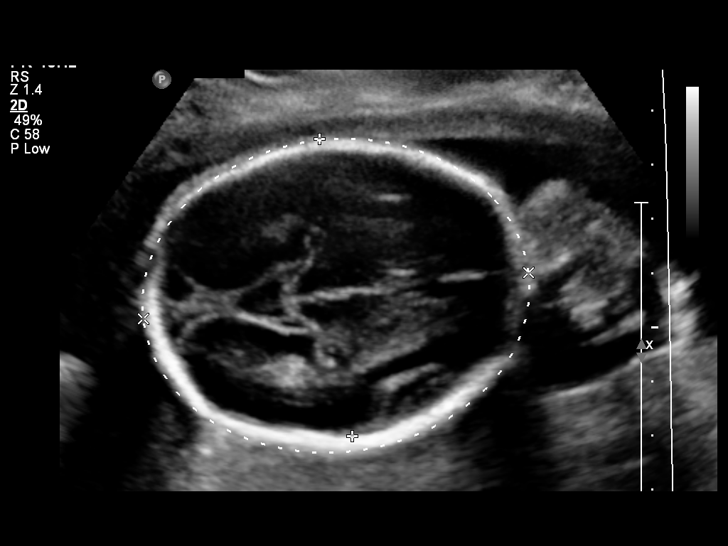
[im 36/40]
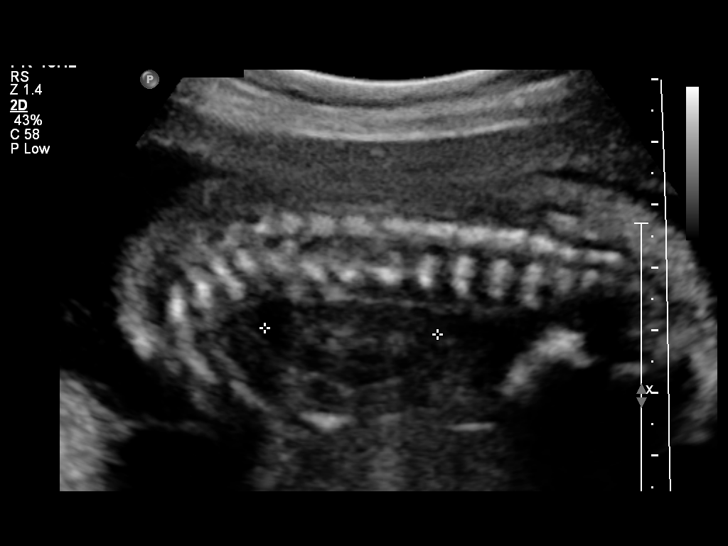

[Series 1: us ob follow up · 3 of 11 slices shown (2 of 2)]
[im 1/11]
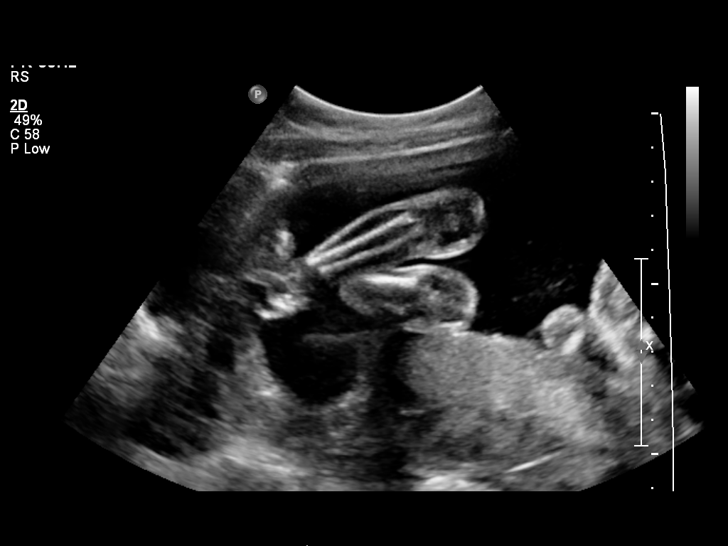
[im 5/11]
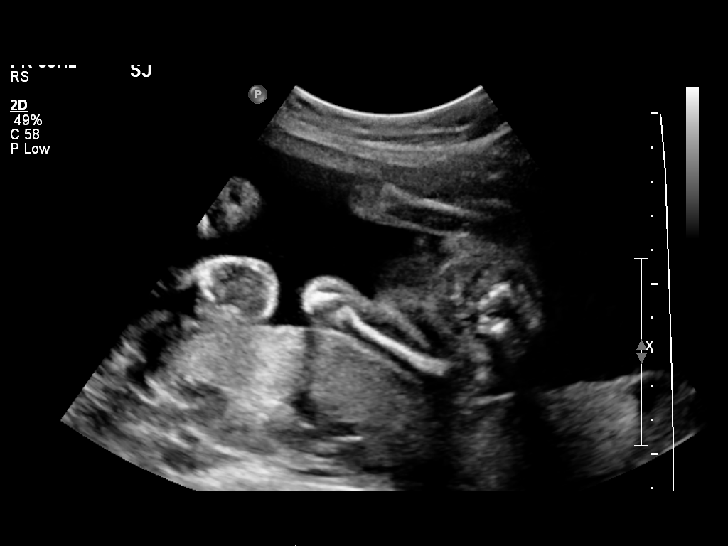
[im 9/11]
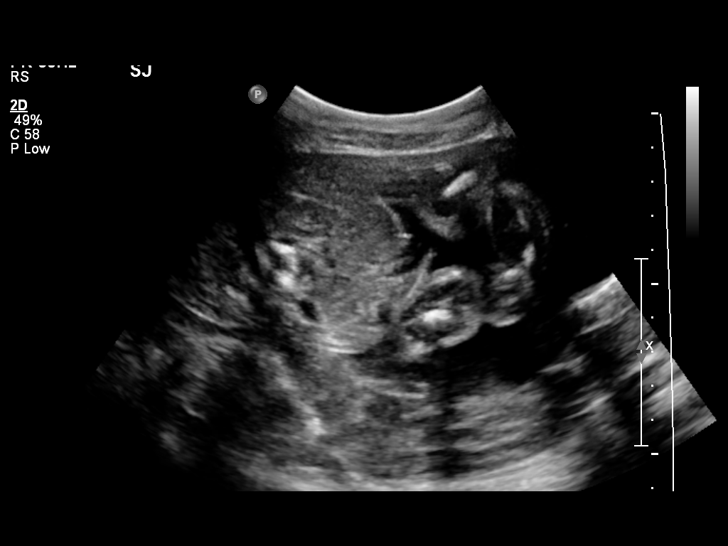

[12 of 28 positions shown; findings below may reference images not displayed]

OBSTETRICS REPORT
                      (Signed Final 03/18/2013 [DATE])

Service(s) Provided

 US OB FOLLOW UP                                       76816.1
Indications

 Cigarette smoker( prior)
 Follow-up incomplete fetal anatomic evaluation
Fetal Evaluation

 Num Of Fetuses:    1
 Fetal Heart Rate:  149                          bpm
 Cardiac Activity:  Observed
 Presentation:      Breech
 Placenta:          Posterior, above cervical
                    os
 P. Cord            Previously Visualized
 Insertion:

 Amniotic Fluid
 AFI FV:      Subjectively within normal limits
                                             Larg Pckt:     6.1  cm
Biometry

 BPD:     55.6  mm     G. Age:  23w 0d                CI:        72.26   70 - 86
                                                      FL/HC:      18.4   18.4 -

 HC:     208.1  mm     G. Age:  22w 6d       57  %    HC/AC:      1.15   1.06 -

 AC:     181.3  mm     G. Age:  23w 0d       59  %    FL/BPD:     68.7   71 - 87
 FL:      38.2  mm     G. Age:  22w 2d       33  %    FL/AC:      21.1   20 - 24

 Est. FW:     525  gm      1 lb 3 oz     54  %
Gestational Age

 Clinical EDD:  22w 3d                                        EDD:   07/19/13
 U/S Today:     22w 6d                                        EDD:   07/16/13
 Best:          22w 3d     Det. By:  Clinical EDD             EDD:   07/19/13
Anatomy

 Cranium:          Appears normal         Aortic Arch:      Previously seen
 Fetal Cavum:      Appears normal         Ductal Arch:      Previously seen
 Ventricles:       Appears normal         Diaphragm:        Appears normal
 Choroid Plexus:   Previously seen        Stomach:          Appears normal, left
                                                            sided
 Cerebellum:       Appears normal         Abdomen:          Appears normal
 Posterior Fossa:  Previously seen        Abdominal Wall:   Previously seen
 Nuchal Fold:      Previously seen        Cord Vessels:     Previously seen
 Face:             Orbits previously      Kidneys:          Appear normal
                   seen
 Lips:             Previously seen        Bladder:          Appears normal
 Heart:            Appears normal         Spine:            Previously seen
                   (4CH, axis, and
                   situs)
 RVOT:             Appears normal         Lower             Previously seen
                                          Extremities:
 LVOT:             Appears normal         Upper             Previously seen
                                          Extremities:

 Other:  Female gender. Heels and 5th digit  previously visualized. Profile
         visualized.
Cervix Uterus Adnexa

 Cervical Length:    3.9      cm

 Cervix:       Normal appearance by transabdominal scan.

 Adnexa:     No abnormality visualized.
Impression

 Single IUP at 22 [DATE] weeks on follow up interval growth and
 attempt to complete fetal survey
 EFW is at the 54th percentile
 Today's images effectively complete our fetal survey, noting
 no evidence of dysmorphic features are seen
 Posterior placenta without previa
 Normal amniotic fluid volume
Recommendations

 Follow up ultrasounds as clinically indicated.

 questions or concerns.

## 2015-09-24 ENCOUNTER — Ambulatory Visit (HOSPITAL_COMMUNITY): Admission: RE | Admit: 2015-09-24 | Payer: Self-pay | Source: Ambulatory Visit

## 2024-01-01 ENCOUNTER — Telehealth: Payer: Self-pay | Admitting: Genetic Counselor

## 2024-01-01 NOTE — Telephone Encounter (Signed)
 Scheduled appointments per referral. Unable to make contact with the patient due to mailbox not being set up.

## 2024-02-22 ENCOUNTER — Inpatient Hospital Stay

## 2024-02-22 ENCOUNTER — Encounter: Payer: Self-pay | Admitting: Genetic Counselor

## 2024-02-22 ENCOUNTER — Inpatient Hospital Stay: Attending: Hematology and Oncology | Admitting: Genetic Counselor

## 2024-02-22 DIAGNOSIS — Z1379 Encounter for other screening for genetic and chromosomal anomalies: Secondary | ICD-10-CM | POA: Insufficient documentation

## 2024-02-22 DIAGNOSIS — Z803 Family history of malignant neoplasm of breast: Secondary | ICD-10-CM | POA: Insufficient documentation

## 2024-02-22 DIAGNOSIS — Z8041 Family history of malignant neoplasm of ovary: Secondary | ICD-10-CM | POA: Diagnosis not present

## 2024-02-22 NOTE — Progress Notes (Signed)
 REFERRING PROVIDER: Jolee Greig VEAR DEVONNA   PRIMARY PROVIDER:  Eveline Lynwood MATSU, MD  PRIMARY REASON FOR VISIT:  1. Family history of malignant neoplasm of breast   2. Family history of malignant neoplasm of ovary      HISTORY OF PRESENT ILLNESS:   Ms. Hubner, a 33 y.o. female, was seen for a [place] cancer genetics consultation at the request of Jolee Greig VEAR, PA-C due to a family history of cancer.  Ms. Needle presents to clinic today to discuss the possibility of a hereditary predisposition to cancer, to discuss genetic testing, and to further clarify her future cancer risks, as well as potential cancer risks for family members.   Ms. Gonzalo is a 33 y.o. female with no personal history of cancer. 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 Copies were not available for review. Release signed, will request results from [facility] [place].   RELEVANT MEDICAL HISTORY:  Menarche was at age 32.  First live birth at age 29.  OCP use for approximately 4 years.  Ovaries intact: yes.  Uterus intact: yes.  Menopausal status: premenopausal.  HRT use: 0 years. Colonoscopy: no; not examined. Mammogram within the last year: no. Number of breast biopsies: 0. Up to date with pelvic exams: yes.  Past Medical History:  Diagnosis Date   Anxiety    Depression    HSV (herpes simplex virus) infection     Past Surgical History:  Procedure Laterality Date   CESAREAN SECTION N/A 07/16/2013   Procedure: CESAREAN SECTION;  Surgeon: Norleen LULLA Server, MD;  Location: [facility];  Service: Obstetrics;  Laterality: N/A;   WISDOM TOOTH EXTRACTION      Social History   Socioeconomic History   Marital status: Single    Spouse name: Not on file   Number of children: Not on file   Years of education: Not on file   Highest education level: Not on file  Occupational History   Not on file  Tobacco Use   Smoking status: Former    Smokeless tobacco: Never  Substance and Sexual Activity   Alcohol use: No   Drug use: Yes    Types: Marijuana   Sexual activity: Yes    Partners: Male  Other Topics Concern   Not on file  Social History Narrative   Not on file   Social Drivers of Health   Financial Resource Strain: Medium Risk (08/20/2023)   Received from [facility]   Overall Financial Resource Strain (CARDIA)    Difficulty of Paying Living Expenses: Somewhat hard  Food Insecurity: No Food Insecurity (08/20/2023)   Received from [facility]   Hunger Vital Sign    Within the past 12 months, you worried that your food would run out before you got the money to buy more.: Never true    Within the past 12 months, the food you bought just didn't last and you didn't have money to get more.: Never true  Transportation Needs: No Transportation Needs (08/20/2023)   Received from [facility]   PRAPARE - Transportation    Lack of Transportation (Medical): No    Lack of Transportation (Non-Medical): No  Physical Activity: Sufficiently Active (08/20/2023)   Received from [facility]   Exercise Vital Sign    On average, how many days per week do you engage in moderate to strenuous exercise (like a brisk walk)?: 7 days  On average, how many minutes do you engage in exercise at this level?: 70 min  Stress: Stress Concern Present (08/20/2023)   Received from [facility] - Occupational Stress Questionnaire    Feeling of Stress : Rather much  Social Connections: Moderately Integrated (08/20/2023)   Received from [facility]   Social Connection and Isolation Panel    In a typical week, how many times do you talk on the phone with family, friends, or neighbors?: Twice a week    How often do you get together with friends or relatives?: Twice a week    How often do you attend church or religious services?: 1 to 4 times per year    Do you belong to any clubs or  organizations such as church groups, unions, fraternal or athletic groups, or school groups?: No    How often do you attend meetings of the clubs or organizations you belong to?: Never    Are you married, widowed, divorced, separated, never married, or living with a partner?: Living with partner     FAMILY HISTORY:  We obtained a detailed, 4-generation family history.  Significant diagnoses are listed below: Family History  Problem Relation Age of Onset   Hypertension Mother    Cervical cancer Sister    Breast cancer Paternal Aunt    Ovarian cancer Maternal Grandmother    Breast cancer Paternal Grandmother    Breast cancer Paternal Great-grandmother     Ms. Stickle reports her maternal half sister and maternal aunt may have tested positive for a gene variant, leading to recommendation for hysterectomy/BSO in their 30s. She will notify us  if she is able to obtain more information on the results of their testing. There is no reported Ashkenazi Jewish ancestry.     GENETIC COUNSELING ASSESSMENT: Ms. Schauer is a 33 y.o. female with a family history of cancer which is somewhat suggestive of a hereditary predisposition to cancer given the family history of two second degree relatives with breast cancer under 50 and one second degree relative with ovarian cancer. We, therefore, discussed and recommended the following at today's visit.   DISCUSSION:  We discussed options for obtaining results of prior genetic testing to determine if additional testing is indicated. Release signed. Will recontact Ms. Wanner once results are available for review.   The Tyrer-Cuzick model is one of multiple prediction models developed to estimate an individual's lifetime risk of developing breast cancer. The Tyrer-Cuzick model is endorsed by the [employer] (NCCN). This model includes many risk factors such as family history, endogenous estrogen exposure, and benign breast disease. The  calculation is highly-dependent on the accuracy of clinical data provided by the patient and can change over time. The Tyrer-Cuzick model may be repeated to reflect new information in her personal or family history in the future.   Based on Ms. Devincenzi's history, a Beatrice Harvey was used to estimate her risk of developing breast cancer. This estimates her lifetime risk of developing breast cancer to be approximately 16%. NCCN recommends consideration of breast MRI screening as an adjunct to mammography for patients at high risk (defined as 20% or greater lifetime risk). Please note that a woman's breast cancer risk changes over time. It may increase or decrease based on age and any changes to the personal and/or family medical history. The risks and recommendations listed above apply to this patient at this point in time. In the future, she may  or may not be eligible for the same medical management strategies and, in some cases, other medical management strategies may become available to her. If she is interested in an updated breast cancer risk assessment at a later date, she can contact us .  For women that do not fall into the high risk breast category (<20% lifetime risk, 5 year risk >1.7, atypia, dense tissue), NCCN recommends breast awareness and clinical encounter every 1-3 years beginning at age 90. For individuals over the age of 56, they recommend breast awareness, annual clinical encounter and annual mammogram with tomosynthesis.   Individuals with an intermediate risk, residual lifetime risk defined as 15-20%, consider supplemental screening on an individual basis, depending on risk factors. Ms. Connaughton should discuss her options for breast cancer surveillance with her primary care providers.   PLAN:  Discuss options for breast cancer surveillance with primary care providers based on intermediate risk.   Obtain prior genetic test results to determine if additional testing is needed.   Obtain  copies of positive familial test reports to confirm that Ms. Ratay did not inherit the maternal variant.   Lastly, we encouraged Ms. Plaia to remain in contact with cancer genetics annually so that we can continuously update the family history and inform her of any changes in cancer genetics and testing that may be of benefit for this family.   Ms. Bower questions were answered to her satisfaction today. Our contact information was provided should additional questions or concerns arise. Thank you for the referral and allowing us  to share in the care of your patient.   Burnard Ogren, MS, [facility] Licensed, Retail [email or web address] phone: [phone]   40 minutes were spent on the date of the encounter in service to the patient including preparation, face-to-face consultation, documentation and care coordination.  The patient brought her friend.  Drs. Gudena and/or Lanny were available to discuss this case as needed.  _______________________________________________________________________ For Office Staff:  Number of people involved in session: 1 Was an Intern/ student involved with case: no GC Warren Ahle observed this visit with patient permission.

## 2024-04-22 ENCOUNTER — Telehealth: Payer: Self-pay | Admitting: Genetic Counselor

## 2024-04-22 NOTE — Telephone Encounter (Signed)
 Unable to obtain copies of hereditary cancer genetic test results. Only results sent by office were for cystic fibrosis carrier screening. Calling to determine if patient is interested in completing hereditary cancer genetic testing. No answer, voicemail full.

## 2024-05-30 NOTE — Telephone Encounter (Signed)
 Unable to obtain copies of hereditary cancer genetic testing, unsure if these were ever completed by OBGYN office. Unable to reach patient to discuss, voicemail full. Will attempt to notify office.
